# Patient Record
Sex: Male | Born: 1955
Health system: Southern US, Community
[De-identification: ages and names within clinical notes are randomized; demographics above are authoritative.]

## PROBLEM LIST (undated history)

## (undated) DIAGNOSIS — I4891 Unspecified atrial fibrillation: Secondary | ICD-10-CM

## (undated) DIAGNOSIS — R569 Unspecified convulsions: Secondary | ICD-10-CM

## (undated) DIAGNOSIS — G20 Parkinson's disease: Secondary | ICD-10-CM

## (undated) DIAGNOSIS — E7800 Pure hypercholesterolemia, unspecified: Secondary | ICD-10-CM

## (undated) DIAGNOSIS — G20A1 Parkinson's disease without dyskinesia, without mention of fluctuations: Secondary | ICD-10-CM

## (undated) HISTORY — PX: CARDIAC ELECTROPHYSIOLOGY STUDY AND ABLATION: SHX1294

## (undated) HISTORY — PX: APPENDECTOMY: SHX54

---

## 2007-07-24 ENCOUNTER — Emergency Department (HOSPITAL_COMMUNITY): Admission: EM | Admit: 2007-07-24 | Discharge: 2007-07-24 | Payer: Self-pay | Admitting: Emergency Medicine

## 2015-03-25 ENCOUNTER — Encounter (HOSPITAL_COMMUNITY): Payer: Self-pay | Admitting: Emergency Medicine

## 2015-03-25 ENCOUNTER — Emergency Department (HOSPITAL_COMMUNITY): Payer: Medicare Other

## 2015-03-25 ENCOUNTER — Emergency Department (HOSPITAL_COMMUNITY)
Admission: EM | Admit: 2015-03-25 | Discharge: 2015-03-25 | Disposition: A | Discharge status: Home / Self Care | Payer: Medicare Other | Attending: Emergency Medicine | Admitting: Emergency Medicine

## 2015-03-25 DIAGNOSIS — W1839XA Other fall on same level, initial encounter: Secondary | ICD-10-CM | POA: Insufficient documentation

## 2015-03-25 DIAGNOSIS — Z7982 Long term (current) use of aspirin: Secondary | ICD-10-CM | POA: Insufficient documentation

## 2015-03-25 DIAGNOSIS — E780 Pure hypercholesterolemia: Secondary | ICD-10-CM | POA: Diagnosis not present

## 2015-03-25 DIAGNOSIS — Z79899 Other long term (current) drug therapy: Secondary | ICD-10-CM | POA: Insufficient documentation

## 2015-03-25 DIAGNOSIS — R079 Chest pain, unspecified: Secondary | ICD-10-CM

## 2015-03-25 DIAGNOSIS — S20211A Contusion of right front wall of thorax, initial encounter: Secondary | ICD-10-CM | POA: Diagnosis not present

## 2015-03-25 DIAGNOSIS — Y9289 Other specified places as the place of occurrence of the external cause: Secondary | ICD-10-CM | POA: Insufficient documentation

## 2015-03-25 DIAGNOSIS — Y9389 Activity, other specified: Secondary | ICD-10-CM | POA: Insufficient documentation

## 2015-03-25 DIAGNOSIS — W19XXXA Unspecified fall, initial encounter: Secondary | ICD-10-CM

## 2015-03-25 DIAGNOSIS — R0602 Shortness of breath: Secondary | ICD-10-CM | POA: Insufficient documentation

## 2015-03-25 DIAGNOSIS — S29001A Unspecified injury of muscle and tendon of front wall of thorax, initial encounter: Secondary | ICD-10-CM | POA: Diagnosis present

## 2015-03-25 DIAGNOSIS — Y998 Other external cause status: Secondary | ICD-10-CM | POA: Insufficient documentation

## 2015-03-25 HISTORY — DX: Pure hypercholesterolemia, unspecified: E78.00

## 2015-03-25 HISTORY — DX: Unspecified convulsions: R56.9

## 2015-03-25 MED ORDER — OXYCODONE-ACETAMINOPHEN 5-325 MG PO TABS
2.0000 | ORAL_TABLET | Freq: Once | ORAL | Status: AC
  Administered 2015-03-25: 2 via ORAL
  Filled 2015-03-25: qty 2

## 2015-03-25 MED ORDER — OXYCODONE-ACETAMINOPHEN 5-325 MG PO TABS: 2.0000 | ORAL_TABLET | ORAL | Status: DC | PRN

## 2015-03-25 NOTE — ED Notes (Addendum)
Patient reports fell last week over a trailer hitch. Reports right chest has been hurting since then. Complaining of chest pain and shortness of breath. States chest pain is worse with movement, coughing, or palpation.

## 2015-03-25 NOTE — Discharge Instructions (Signed)
Place a pillow against yourchest in either cough, or take 10 deep breaths several times per day. Percocet for pain  Chest Wall Pain Chest wall pain is pain in or around the bones and muscles of your chest. It may take up to 6 weeks to get better. It may take longer if you must stay physically active in your work and activities.  CAUSES  Chest wall pain may happen on its own. However, it may be caused by:  A viral illness like the flu.  Injury.  Coughing.  Exercise.  Arthritis.  Fibromyalgia.  Shingles. HOME CARE INSTRUCTIONS   Avoid overtiring physical activity. Try not to strain or perform activities that cause pain. This includes any activities using your chest or your abdominal and side muscles, especially if heavy weights are used.  Put ice on the sore area.  Put ice in a plastic bag.  Place a towel between your skin and the bag.  Leave the ice on for 15-20 minutes per hour while awake for the first 2 days.  Only take over-the-counter or prescription medicines for pain, discomfort, or fever as directed by your caregiver. SEEK IMMEDIATE MEDICAL CARE IF:   Your pain increases, or you are very uncomfortable.  You have a fever.  Your chest pain becomes worse.  You have new, unexplained symptoms.  You have nausea or vomiting.  You feel sweaty or lightheaded.  You have a cough with phlegm (sputum), or you cough up blood. MAKE SURE YOU:   Understand these instructions.  Will watch your condition.  Will get help right away if you are not doing well or get worse. Document Released: 11/25/2005 Document Revised: 02/17/2012 Document Reviewed: 07/22/2011 [facility] Patient Information 2015 [place], [place]. This information is not intended to replace advice given to you by your health care provider. Make sure you discuss any questions you have with your health care provider.

## 2015-03-25 NOTE — ED Provider Notes (Signed)
CSN: [id number]     Arrival date & time 03/25/15  2015 History  This chart was scribed for Rolland PorterMark Lachlan Mckim, MD by Jarvis Morganaylor Ferguson, ED Scribe. This patient was seen in room APA14/APA14 and the patient's care was started at 8:34 PM.    Chief Complaint  Patient presents with  . Chest Pain  . Shortness of Breath    The history is provided by the patient. No language interpreter was used.    HPI Comments: Eric [name] is a 59 y.o. male with a h/o seizures and hypercholesteremia who presents to the Emergency Department complaining of constant, moderate, right parasternal chest pain that began 1 week ago. Pt states he was helping someone hitch a trailer and fell over the hitch and has been having chest pain ever since. Pt believes he hit his chest on the hitch when he fell. He is having associated intermittent SOB. Pt takes 81 mg aspirin daily. He reports the pain is worse with movement and coughing. He denies any abnormal breath sounds. Pt states he took  Motrin and prescription pain medication at home with no relief.  Pt reports the prescription pain medications were for a previous problem. He denies any head injury. He denies any neck pain, back pain, abdominal pain, or upper and lower extremity pain.    Past Medical History  Diagnosis Date  . Hypercholesteremia   . Seizures    History reviewed. No pertinent past surgical history. History reviewed. No pertinent family history. History  Substance Use Topics  . Smoking status: Never Smoker   . Smokeless tobacco: Not on file  . Alcohol Use: No    Review of Systems  Constitutional: Negative for fever, chills, diaphoresis, appetite change and fatigue.  HENT: Negative for mouth sores, sore throat and trouble swallowing.   Eyes: Negative for visual disturbance.  Respiratory: Positive for shortness of breath. Negative for cough, chest tightness and wheezing.   Cardiovascular: Positive for chest pain.  Gastrointestinal: Negative for nausea,  vomiting, abdominal pain, diarrhea and abdominal distention.  Endocrine: Negative for polydipsia, polyphagia and polyuria.  Genitourinary: Negative for dysuria, frequency and hematuria.  Musculoskeletal: Negative for back pain, arthralgias, gait problem and neck pain.  Skin: Negative for color change, pallor and rash.  Neurological: Negative for dizziness, syncope, light-headedness and headaches.  Hematological: Does not bruise/bleed easily.  Psychiatric/Behavioral: Negative for behavioral problems and confusion.      Allergies  Review of patient's allergies indicates no known allergies.  Home Medications   Prior to Admission medications   Medication Sig Start Date End Date Taking? Authorizing Provider  aspirin EC 81 MG tablet Take 81 mg by mouth daily.   Yes Historical Provider, MD  carbamazepine (TEGRETOL) 200 MG tablet Take 400 mg by mouth 3 (three) times daily.   Yes Historical Provider, MD  divalproex (DEPAKOTE ER) 250 MG 24 hr tablet Take 250 mg by mouth at bedtime. *TAKES IN ADDITION TO 2 500MG  CAPSULES FOR A TOTAL 1250MG  AT BEDTIME   Yes Historical Provider, MD  divalproex (DEPAKOTE ER) 500 MG 24 hr tablet Take 1,000 mg by mouth 2 (two) times daily.   Yes Historical Provider, MD  sildenafil (VIAGRA) 100 MG tablet Take 100 mg by mouth daily as needed for erectile dysfunction.   Yes Historical Provider, MD  simvastatin (ZOCOR) 40 MG tablet Take 40 mg by mouth at bedtime.   Yes Historical Provider, MD  Vitamin D, Ergocalciferol, (DRISDOL) 50000 UNITS CAPS capsule Take 50,000 Units by mouth every Thursday.  Yes Historical Provider, MD  oxyCODONE-acetaminophen (PERCOCET/ROXICET) 5-325 MG per tablet Take 2 tablets by mouth every 4 (four) hours as needed. 03/25/15   Rolland Porter, MD   Triage Vitals: BP 154/82 mmHg  Pulse 59  Temp(Src) 98 F (36.7 C) (Oral)  Resp 20  Ht  (1.778 m)  Wt 260 lb (117.935 kg)  BMI 37.31 kg/m2  SpO2 99%  Physical Exam  Constitutional: He is  oriented to person, place, and time. He appears well-developed and well-nourished. No distress.  HENT:  Head: Normocephalic.  Mouth/Throat: Uvula is midline, oropharynx is clear and moist and mucous membranes are normal.  Eyes: Conjunctivae are normal. Pupils are equal, round, and reactive to light. No scleral icterus.  Neck: Normal range of motion. Neck supple. No thyromegaly present.  Cardiovascular: Normal rate and regular rhythm.  Exam reveals no gallop and no friction rub.   No murmur heard. Pulmonary/Chest: Effort normal and breath sounds normal. No respiratory distress. He has no wheezes. He has no rales.  No crepitus. Tenderness to right anterior chest. No subcutaneous air of the chest.   Abdominal: Soft. Bowel sounds are normal. He exhibits no distension. There is no tenderness. There is no rebound.  Musculoskeletal: Normal range of motion.  Neurological: He is alert and oriented to person, place, and time.  Skin: Skin is warm and dry. No rash noted.  Psychiatric: He has a normal mood and affect. His behavior is normal.    ED Course  Procedures (including critical care time)  DIAGNOSTIC STUDIES: Oxygen Saturation is 99% on RA, normal by my interpretation.    COORDINATION OF CARE:    Labs Review Labs Reviewed - No data to display  Imaging Review Dg Ribs Unilateral W/chest Right  03/25/2015   CLINICAL DATA:  Constant, moderate right parasternal chest pain after helping hitch a trailer.  EXAM: RIGHT RIBS AND CHEST - 3+ VIEW  COMPARISON:  None.  FINDINGS: Normal heart size and mediastinal contours. No acute infiltrate or edema. No effusion or pneumothorax. No fracture or focal bone lesion involving the right ribs. The pain marker overlaps the right fifth anterior costochondral junction.  IMPRESSION: Negative.   Electronically Signed   By: Marnee Spring M.D.   On: 03/25/2015 21:40     EKG Interpretation None      MDM   Final diagnoses:  Fall  Chest pain  Chest wall  contusion, right, initial encounter    X-ray shows no acute moderately severe chest wall or mono. Plan is home, pulmonary toilet discussed. Percent prescription.  I personally performed the services described in this documentation, which was scribed in my presence. The recorded information has been reviewed and is accurate.     Rolland Porter, MD 03/25/15 [phone]

## 2015-03-25 NOTE — ED Notes (Signed)
Pt alert & oriented x4, stable gait. Patient given discharge instructions, paperwork & prescription(s). Patient informed not to drive, operate any equipment & handel any important documents 4 hours after taking pain medication. Patient  instructed to stop at the registration desk to finish any additional paperwork. Patient  verbalized understanding. Pt left department w/ no further questions. 

## 2015-12-24 ENCOUNTER — Emergency Department (HOSPITAL_COMMUNITY)
Admission: EM | Admit: 2015-12-24 | Discharge: 2015-12-24 | Disposition: A | Discharge status: Home / Self Care | Payer: Medicare Other | Attending: Emergency Medicine | Admitting: Emergency Medicine

## 2015-12-24 ENCOUNTER — Encounter (HOSPITAL_COMMUNITY): Payer: Self-pay | Admitting: Emergency Medicine

## 2015-12-24 DIAGNOSIS — W57XXXA Bitten or stung by nonvenomous insect and other nonvenomous arthropods, initial encounter: Secondary | ICD-10-CM | POA: Diagnosis not present

## 2015-12-24 DIAGNOSIS — Z79899 Other long term (current) drug therapy: Secondary | ICD-10-CM | POA: Diagnosis not present

## 2015-12-24 DIAGNOSIS — Y9389 Activity, other specified: Secondary | ICD-10-CM | POA: Diagnosis not present

## 2015-12-24 DIAGNOSIS — E7800 Pure hypercholesterolemia, unspecified: Secondary | ICD-10-CM | POA: Diagnosis not present

## 2015-12-24 DIAGNOSIS — S1086XA Insect bite of other specified part of neck, initial encounter: Secondary | ICD-10-CM | POA: Diagnosis not present

## 2015-12-24 DIAGNOSIS — Y998 Other external cause status: Secondary | ICD-10-CM | POA: Diagnosis not present

## 2015-12-24 DIAGNOSIS — Y9289 Other specified places as the place of occurrence of the external cause: Secondary | ICD-10-CM | POA: Insufficient documentation

## 2015-12-24 DIAGNOSIS — Z7982 Long term (current) use of aspirin: Secondary | ICD-10-CM | POA: Diagnosis not present

## 2015-12-24 DIAGNOSIS — S1096XA Insect bite of unspecified part of neck, initial encounter: Secondary | ICD-10-CM | POA: Diagnosis not present

## 2015-12-24 MED ORDER — BACITRACIN ZINC 500 UNIT/GM EX OINT
TOPICAL_OINTMENT | Freq: Once | CUTANEOUS | Status: AC
  Administered 2015-12-24: 15:00:00 via TOPICAL

## 2015-12-24 MED ORDER — BACITRACIN ZINC 500 UNIT/GM EX OINT
TOPICAL_OINTMENT | CUTANEOUS | Status: AC
  Filled 2015-12-24: qty 0.9

## 2015-12-24 MED ORDER — BACITRACIN-NEOMYCIN-POLYMYXIN 400-5-5000 EX OINT: TOPICAL_OINTMENT | Freq: Once | CUTANEOUS | Status: DC

## 2015-12-24 NOTE — ED Provider Notes (Signed)
CSN: [id number]     Arrival date & time 12/24/15  1301 History  By signing my name below, I, Elon Spanner, attest that this documentation has been prepared under the direction and in the presence of Trisha Mangle, PA-C. Electronically Signed: Elon Spanner ED Scribe. 12/24/2015. 1:32 PM.    Chief Complaint  Patient presents with  . Tick Removal   The history is provided by the patient. No language interpreter was used.   HPI Comments: Eric [name] is a 60 y.o. male with hx of seizures who presents to the Emergency Department requesting tick removal from the right-sided neck.  The patient reports his wife was able to remove part of the tick but he believes some pieces are still lodged in place.  The patient uses daily ASA but no other anti-coagulants.  He denies other complaints.   Past Medical History  Diagnosis Date  . Hypercholesteremia   . Seizures (HCC)    History reviewed. No pertinent past surgical history. No family history on file. Social History  Substance Use Topics  . Smoking status: Never Smoker   . Smokeless tobacco: Never Used  . Alcohol Use: No    Review of Systems 10 Systems reviewed and all are negative for acute change except as noted in the HPI.  Allergies  Review of patient's allergies indicates no known allergies.  Home Medications   Prior to Admission medications   Medication Sig Start Date End Date Taking? Authorizing Provider  aspirin EC 81 MG tablet Take 81 mg by mouth daily.    Historical Provider, MD  carbamazepine (TEGRETOL) 200 MG tablet Take 400 mg by mouth 3 (three) times daily.    Historical Provider, MD  divalproex (DEPAKOTE ER) 250 MG 24 hr tablet Take 250 mg by mouth at bedtime. *TAKES IN ADDITION TO 2 500MG  CAPSULES FOR A TOTAL 1250MG  AT BEDTIME    Historical Provider, MD  divalproex (DEPAKOTE ER) 500 MG 24 hr tablet Take 1,000 mg by mouth 2 (two) times daily.    Historical Provider, MD  oxyCODONE-acetaminophen (PERCOCET/ROXICET) 5-325 MG  per tablet Take 2 tablets by mouth every 4 (four) hours as needed. 03/25/15   Rolland Porter, MD  sildenafil (VIAGRA) 100 MG tablet Take 100 mg by mouth daily as needed for erectile dysfunction.    Historical Provider, MD  simvastatin (ZOCOR) 40 MG tablet Take 40 mg by mouth at bedtime.    Historical Provider, MD  Vitamin D, Ergocalciferol, (DRISDOL) 50000 UNITS CAPS capsule Take 50,000 Units by mouth every Thursday.    Historical Provider, MD   BP 133/77 mmHg  Pulse 58  Temp(Src) 98.2 F (36.8 C) (Oral)  Resp 18  Ht 5\' 11"  (1.803 m)  Wt 260 lb (117.935 kg)  BMI 36.28 kg/m2  SpO2 96% Physical Exam  Constitutional: He is oriented to person, place, and time. He appears well-developed and well-nourished. No distress.  HENT:  Head: Normocephalic and atraumatic.  Eyes: Conjunctivae and EOM are normal.  Neck: Neck supple. No tracheal deviation present.  Cardiovascular: Normal rate.   Pulmonary/Chest: Effort normal. No respiratory distress.  Musculoskeletal: Normal range of motion.  Neurological: He is alert and oriented to person, place, and time.  Skin: Skin is warm and dry.  Psychiatric: He has a normal mood and affect. His behavior is normal.  Nursing note and vitals reviewed.   ED Course  Procedures (including critical care time)  DIAGNOSTIC STUDIES: Oxygen Saturation is 96% on RA, normal by my interpretation.  COORDINATION OF CARE:  2:37 PM Discuss plan to remove tick.  Patient acknowledges and agrees with plan.    Labs Review Labs Reviewed - No data to display  Imaging Review No results found. I have personally reviewed and evaluated these images and lab results as part of my medical decision-making.   EKG Interpretation None      MDM  Tick  Head removed with 18 gauge and twizzer   Final diagnoses:  Tick bite     I personally performed the services in this documentation, which was scribed in my presence.  The recorded information has been reviewed and  considered.   Barnet PallKaren SofiaPAC.   Elson AreasLeslie K Sofia, PA-C 12/24/15 [address]1501  Leslie K HarrisonSofia, PA-C 12/24/15 1523  Donnetta HutchingBrian Cook, MD 12/27/15 1332

## 2015-12-24 NOTE — Discharge Instructions (Signed)
Tick Bite Information Ticks are insects that attach themselves to the skin and draw blood for food. There are various types of ticks. Common types include wood ticks and deer ticks. Most ticks live in shrubs and grassy areas. Ticks can climb onto your body when you make contact with leaves or grass where the tick is waiting. The most common places on the body for ticks to attach themselves are the scalp, neck, armpits, waist, and groin. Most tick bites are harmless, but sometimes ticks carry germs that cause diseases. These germs can be spread to a person during the tick's feeding process. The chance of a disease spreading through a tick bite depends on:   The type of tick.  Time of year.   How long the tick is attached.   Geographic location.  HOW CAN YOU PREVENT TICK BITES? Take these steps to help prevent tick bites when you are outdoors:  Wear protective clothing. Long sleeves and long pants are best.   Wear white clothes so you can see ticks more easily.  Tuck your pant legs into your socks.   If walking on a trail, stay in the middle of the trail to avoid brushing against bushes.  Avoid walking through areas with long grass.  Put insect repellent on all exposed skin and along boot tops, pant legs, and sleeve cuffs.   Check clothing, hair, and skin repeatedly and before going inside.   Brush off any ticks that are not attached.  Take a shower or bath as soon as possible after being outdoors.  WHAT IS THE PROPER WAY TO REMOVE A TICK? Ticks should be removed as soon as possible to help prevent diseases caused by tick bites. 1. If latex gloves are available, put them on before trying to remove a tick.  2. Using fine-point tweezers, grasp the tick as close to the skin as possible. You may also use curved forceps or a tick removal tool. Grasp the tick as close to its head as possible. Avoid grasping the tick on its body. 3. Pull gently with steady upward pressure until  the tick lets go. Do not twist the tick or jerk it suddenly. This may break off the tick's head or mouth parts. 4. Do not squeeze or crush the tick's body. This could force disease-carrying fluids from the tick into your body.  5. After the tick is removed, wash the bite area and your hands with soap and water or other disinfectant such as alcohol. 6. Apply a small amount of antiseptic cream or ointment to the bite site.  7. Wash and disinfect any instruments that were used.  Do not try to remove a tick by applying a hot match, petroleum jelly, or fingernail polish to the tick. These methods do not work and may increase the chances of disease being spread from the tick bite.  WHEN SHOULD YOU SEEK MEDICAL CARE? Contact your health care provider if you are unable to remove a tick from your skin or if a part of the tick breaks off and is stuck in the skin.  After a tick bite, you need to be aware of signs and symptoms that could be related to diseases spread by ticks. Contact your health care provider if you develop any of the following in the days or weeks after the tick bite:  Unexplained fever.  Rash. A circular rash that appears days or weeks after the tick bite may indicate the possibility of Lyme disease. The rash may resemble   a target with a bull's-eye and may occur at a different part of your body than the tick bite.  Redness and swelling in the area of the tick bite.   Tender, swollen lymph glands.   Diarrhea.   Weight loss.   Cough.   Fatigue.   Muscle, joint, or bone pain.   Abdominal pain.   Headache.   Lethargy or a change in your level of consciousness.  Difficulty walking or moving your legs.   Numbness in the legs.   Paralysis.  Shortness of breath.   Confusion.   Repeated vomiting.    This information is not intended to replace advice given to you by your health care provider. Make sure you discuss any questions you have with your health  care provider.   Document Released: 11/22/2000 Document Revised: 12/16/2014 Document Reviewed: 05/05/2013 Elsevier Interactive Patient Education 2016 [employer]  

## 2015-12-24 NOTE — ED Notes (Signed)
Patient here for tick removal. Per patient removed tick from right side of neck but "head of tick still in skin."

## 2016-02-25 ENCOUNTER — Encounter (HOSPITAL_COMMUNITY): Payer: Self-pay | Admitting: *Deleted

## 2016-02-25 ENCOUNTER — Emergency Department (HOSPITAL_COMMUNITY)
Admission: EM | Admit: 2016-02-25 | Discharge: 2016-02-25 | Disposition: A | Discharge status: Home / Self Care | Payer: Medicare Other | Attending: Emergency Medicine | Admitting: Emergency Medicine

## 2016-02-25 ENCOUNTER — Emergency Department (HOSPITAL_COMMUNITY): Payer: Medicare Other

## 2016-02-25 DIAGNOSIS — J189 Pneumonia, unspecified organism: Secondary | ICD-10-CM | POA: Insufficient documentation

## 2016-02-25 DIAGNOSIS — Z7982 Long term (current) use of aspirin: Secondary | ICD-10-CM | POA: Insufficient documentation

## 2016-02-25 DIAGNOSIS — E7800 Pure hypercholesterolemia, unspecified: Secondary | ICD-10-CM | POA: Diagnosis not present

## 2016-02-25 DIAGNOSIS — Z79899 Other long term (current) drug therapy: Secondary | ICD-10-CM | POA: Insufficient documentation

## 2016-02-25 DIAGNOSIS — R509 Fever, unspecified: Secondary | ICD-10-CM | POA: Diagnosis not present

## 2016-02-25 DIAGNOSIS — R05 Cough: Secondary | ICD-10-CM | POA: Diagnosis not present

## 2016-02-25 MED ORDER — AZITHROMYCIN 250 MG PO TABS
500.0000 mg | ORAL_TABLET | Freq: Once | ORAL | Status: AC
  Administered 2016-02-25: 500 mg via ORAL
  Filled 2016-02-25: qty 2

## 2016-02-25 MED ORDER — AZITHROMYCIN 250 MG PO TABS: ORAL_TABLET | ORAL | Status: DC

## 2016-02-25 MED ORDER — DEXAMETHASONE 4 MG PO TABS: 4.0000 mg | ORAL_TABLET | Freq: Two times a day (BID) | ORAL | Status: DC

## 2016-02-25 MED ORDER — CEFTRIAXONE SODIUM 1 G IJ SOLR
1.0000 g | Freq: Once | INTRAMUSCULAR | Status: AC
  Administered 2016-02-25: 1 g via INTRAMUSCULAR
  Filled 2016-02-25: qty 10

## 2016-02-25 MED ORDER — HYDROCODONE-HOMATROPINE 5-1.5 MG/5ML PO SYRP
5.0000 mL | ORAL_SOLUTION | Freq: Four times a day (QID) | ORAL | Status: DC | PRN
Start: 2016-02-25 — End: 2016-11-10

## 2016-02-25 MED ORDER — LIDOCAINE HCL (PF) 1 % IJ SOLN
INTRAMUSCULAR | Status: AC
  Administered 2016-02-25: 5 mL
  Filled 2016-02-25: qty 5

## 2016-02-25 MED ORDER — PREDNISONE 10 MG PO TABS
60.0000 mg | ORAL_TABLET | Freq: Once | ORAL | Status: AC
  Administered 2016-02-25: 60 mg via ORAL
  Filled 2016-02-25: qty 1

## 2016-02-25 MED ORDER — HYDROCOD POLST-CPM POLST ER 10-8 MG/5ML PO SUER
5.0000 mL | Freq: Once | ORAL | Status: AC
  Administered 2016-02-25: 5 mL via ORAL
  Filled 2016-02-25: qty 5

## 2016-02-25 NOTE — ED Provider Notes (Signed)
History  By signing my name below, I, Karle Plumber, attest that this documentation has been prepared under the direction and in the presence of Ivery Quale, PA-C. Electronically Signed: Karle Plumber, ED Scribe. 02/25/2016. 7:16 PM.  Chief Complaint  Patient presents with  . Cough   The history is provided by the patient and medical records. No language interpreter was used.    HPI Comments:  Eric [name] is a 60 y.o. obese male who presents to the Emergency Department complaining of subjective fever and chills that began about three days ago. He reports associated "shakes", nausea and productive cough of clear mucous. He has not taken anything to treat his symptoms. He denies modifying factors. He denies vomiting, diarrhea, rash, nasal congestion. He denies smoking but states people in his household smoke but not inside.He reports h/o HLD and seizures.  Past Medical History  Diagnosis Date  . Hypercholesteremia   . Seizures (HCC)    History reviewed. No pertinent past surgical history. No family history on file. Social History  Substance Use Topics  . Smoking status: Never Smoker   . Smokeless tobacco: Never Used  . Alcohol Use: No    Review of Systems  Constitutional: Positive for fever and chills.  Respiratory: Positive for cough.   Gastrointestinal: Positive for nausea.  All other systems reviewed and are negative.   Allergies  Review of patient's allergies indicates no known allergies.  Home Medications   Prior to Admission medications   Medication Sig Start Date End Date Taking? Authorizing Provider  aspirin EC 81 MG tablet Take 81 mg by mouth daily.   Yes Historical Provider, MD  carbamazepine (TEGRETOL) 200 MG tablet Take 400 mg by mouth 3 (three) times daily.   Yes Historical Provider, MD  divalproex (DEPAKOTE ER) 250 MG 24 hr tablet Take 250 mg by mouth at bedtime. *TAKES IN ADDITION TO 2  CAPSULES FOR A TOTAL  AT BEDTIME   Yes Historical  Provider, MD  divalproex (DEPAKOTE ER) 500 MG 24 hr tablet Take 1,000 mg by mouth 2 (two) times daily.   Yes Historical Provider, MD  simvastatin (ZOCOR) 40 MG tablet Take 40 mg by mouth at bedtime.   Yes Historical Provider, MD  oxyCODONE-acetaminophen (PERCOCET/ROXICET) 5-325 MG per tablet Take 2 tablets by mouth every 4 (four) hours as needed. Patient not taking: Reported on 02/25/2016 03/25/15   Rolland Porter, MD  sildenafil (VIAGRA) 100 MG tablet Take 100 mg by mouth daily as needed for erectile dysfunction.    Historical Provider, MD  Vitamin D, Ergocalciferol, (DRISDOL) 50000 UNITS CAPS capsule Take 50,000 Units by mouth every Thursday.    Historical Provider, MD   Triage Vitals: BP 151/86 mmHg  Pulse 79  Temp(Src) 99.6 F (37.6 C) (Oral)  Resp 18  Ht  (1.803 m)  Wt 260 lb (117.935 kg)  BMI 36.28 kg/m2  SpO2 100% Physical Exam  Constitutional: He is oriented to person, place, and time. He appears well-developed and well-nourished.  HENT:  Head: Normocephalic and atraumatic.  Mild nasal congestion present.  Eyes: EOM are normal.  Neck: Normal range of motion.  Cardiovascular: Normal rate, regular rhythm and normal heart sounds.  Exam reveals no gallop and no friction rub.   No murmur heard. Pulmonary/Chest: Effort normal. No respiratory distress. He has no wheezes. He has no rales.  Pt speaks in complete sentences. Symmetrical rise and fall of the chest. Course breath sounds but no consolidation.  Musculoskeletal: Normal range of motion.  Cap refill less than two seconds.  Neurological: He is alert and oriented to person, place, and time.  Tremors present  Skin: Skin is warm and dry.  Good color. No rash.  Psychiatric: He has a normal mood and affect. His behavior is normal.  Nursing note and vitals reviewed.   ED Course  Procedures (including critical care time) DIAGNOSTIC STUDIES: Oxygen Saturation is 100% on RA, normal by my interpretation.   COORDINATION OF  CARE: 7:16 PM- Will order CXR. Pt verbalizes understanding and agrees to plan.  Medications - No data to display  Labs Review Labs Reviewed - No data to display  Imaging Review No results found. I have personally reviewed and evaluated these images and lab results as part of my medical decision-making.   EKG Interpretation None      MDM  Vital signs reviewed. Pulse oximetry is 100% on room air, within normal limits by my interpretation. Chest x-ray is consistent with a right upper lobe pneumonia. The patient was treated in the emergency department with intramuscular Rocephin and Zithromax. Prescription for Zithromax, Decadron, and Hycodan given to the patient. The patient will use Tylenol every 4 hours for fever or aching. The patient strongly encouraged to see his primary physician Dr. Ernestine McmurrayIngle in 3-4 days for recheck. Patient is in agreement with this discharge instruction.    Final diagnoses:  Community acquired pneumonia    *I have reviewed nursing notes, vital signs, and all appropriate lab and imaging results for this patient.**  I personally performed the services described in this documentation, which was scribed in my presence. The recorded information has been reviewed and is accurate.    Ivery QualeHobson Loring Liskey, PA-C 02/27/16 1032  Raeford RazorStephen Kohut, MD 02/29/16 1600

## 2016-02-25 NOTE — Discharge Instructions (Signed)
Your x-ray suggests right lung pneumonia. Please increase fluids. Use Tylenol every 4 hours for fever and aching. Please usual mask until symptoms have resolved. Zithromax 1 tablet daily until all taken. Decadron 2 times daily until all taken. Use Hycodan for cough. This medication may cause drowsiness, please use with caution. Please see your primary physician later this week for follow-up and recheck. Return to the emergency department if any changes, difficulty breathing, or concerns. Community-Acquired Pneumonia, Adult Pneumonia is an infection of the lungs. One type of pneumonia can happen while a person is in a hospital. A different type can happen when a person is not in a hospital (community-acquired pneumonia). It is easy for this kind to spread from person to person. It can spread to you if you breathe near an infected person who coughs or sneezes. Some symptoms include:  A dry cough.  A wet (productive) cough.  Fever.  Sweating.  Chest pain. HOME CARE  Take over-the-counter and prescription medicines only as told by your doctor.  Only take cough medicine if you are losing sleep.  If you were prescribed an antibiotic medicine, take it as told by your doctor. Do not stop taking the antibiotic even if you start to feel better.  Sleep with your head and neck raised (elevated). You can do this by putting a few pillows under your head, or you can sleep in a recliner.  Do not use tobacco products. These include cigarettes, chewing tobacco, and e-cigarettes. If you need help quitting, ask your doctor.  Drink enough water to keep your pee (urine) clear or pale yellow. A shot (vaccine) can help prevent pneumonia. Shots are often suggested for:  People older than 60 years of age.  People older than 60 years of age:  Who are having cancer treatment.  Who have long-term (chronic) lung disease.  Who have problems with their body's defense system (immune system). You may also prevent  pneumonia if you take these actions:  Get the flu (influenza) shot every year.  Go to the dentist as often as told.  Wash your hands often. If soap and water are not available, use hand sanitizer. GET HELP IF:  You have a fever.  You lose sleep because your cough medicine does not help. GET HELP RIGHT AWAY IF:  You are short of breath and it gets worse.  You have more chest pain.  Your sickness gets worse. This is very serious if:  You are an older adult.  Your body's defense system is weak.  You cough up blood.   This information is not intended to replace advice given to you by your health care provider. Make sure you discuss any questions you have with your health care provider.   Document Released: 05/13/2008 Document Revised: 08/16/2015 Document Reviewed: 03/22/2015 Elsevier Interactive Patient Education [employer]2016 Elsevier Inc.

## 2016-02-25 NOTE — ED Notes (Signed)
Pt comes in with cough, congestion, and fevers starting 3 days ago. Pt denies any n/v/d. Denies any pain.

## 2016-11-10 ENCOUNTER — Encounter (HOSPITAL_COMMUNITY): Payer: Self-pay | Admitting: Emergency Medicine

## 2016-11-10 ENCOUNTER — Emergency Department (HOSPITAL_COMMUNITY): Payer: Medicare Other

## 2016-11-10 ENCOUNTER — Emergency Department (HOSPITAL_COMMUNITY)
Admission: EM | Admit: 2016-11-10 | Discharge: 2016-11-10 | Disposition: A | Discharge status: Home / Self Care | Payer: Medicare Other | Attending: Emergency Medicine | Admitting: Emergency Medicine

## 2016-11-10 DIAGNOSIS — Z7901 Long term (current) use of anticoagulants: Secondary | ICD-10-CM | POA: Insufficient documentation

## 2016-11-10 DIAGNOSIS — Z79899 Other long term (current) drug therapy: Secondary | ICD-10-CM | POA: Insufficient documentation

## 2016-11-10 DIAGNOSIS — S199XXA Unspecified injury of neck, initial encounter: Secondary | ICD-10-CM | POA: Diagnosis not present

## 2016-11-10 DIAGNOSIS — R55 Syncope and collapse: Secondary | ICD-10-CM | POA: Diagnosis not present

## 2016-11-10 DIAGNOSIS — R569 Unspecified convulsions: Secondary | ICD-10-CM | POA: Diagnosis not present

## 2016-11-10 LAB — COMPREHENSIVE METABOLIC PANEL
ALK PHOS: 37 U/L — AB (ref 38–126)
ALT: 8 U/L — ABNORMAL LOW (ref 17–63)
AST: 16 U/L (ref 15–41)
Albumin: 2.9 g/dL — ABNORMAL LOW (ref 3.5–5.0)
Anion gap: 6 (ref 5–15)
BUN: 9 mg/dL (ref 6–20)
CHLORIDE: 103 mmol/L (ref 101–111)
CO2: 29 mmol/L (ref 22–32)
Calcium: 8.4 mg/dL — ABNORMAL LOW (ref 8.9–10.3)
Creatinine, Ser: 0.92 mg/dL (ref 0.61–1.24)
GFR calc Af Amer: >60 mL/min (ref >60)
GFR calc non Af Amer: >60 mL/min (ref >60)
Glucose, Bld: 82 mg/dL (ref 65–99)
Potassium: 3.8 mmol/L (ref 3.5–5.1)
SODIUM: 138 mmol/L (ref 135–145)
Total Bilirubin: 0.3 mg/dL (ref 0.3–1.2)
Total Protein: 6.4 g/dL — ABNORMAL LOW (ref 6.5–8.1)

## 2016-11-10 LAB — I-STAT CHEM 8, ED
BUN: 6 mg/dL (ref 6–20)
CALCIUM ION: 1.17 mmol/L (ref 1.15–1.40)
CHLORIDE: 103 mmol/L (ref 101–111)
Creatinine, Ser: 1 mg/dL (ref 0.61–1.24)
GLUCOSE: 76 mg/dL (ref 65–99)
HCT: 33 % — ABNORMAL LOW (ref 39.0–52.0)
Hemoglobin: 11.2 g/dL — ABNORMAL LOW (ref 13.0–17.0)
POTASSIUM: 3.8 mmol/L (ref 3.5–5.1)
Sodium: 141 mmol/L (ref 135–145)
TCO2: 27 mmol/L (ref 0–100)

## 2016-11-10 LAB — CBC WITH DIFFERENTIAL/PLATELET
BASOS PCT: 0 %
Basophils Absolute: 0 10*3/uL (ref 0.0–0.1)
EOS ABS: 0.2 10*3/uL (ref 0.0–0.7)
EOS PCT: 3 %
HCT: 33.8 % — ABNORMAL LOW (ref 39.0–52.0)
Hemoglobin: 11.3 g/dL — ABNORMAL LOW (ref 13.0–17.0)
LYMPHS ABS: 2.2 10*3/uL (ref 0.7–4.0)
Lymphocytes Relative: 30 %
MCH: 34.2 pg — ABNORMAL HIGH (ref 26.0–34.0)
MCHC: 33.4 g/dL (ref 30.0–36.0)
MCV: 102.4 fL — ABNORMAL HIGH (ref 78.0–100.0)
Monocytes Absolute: 1.7 10*3/uL — ABNORMAL HIGH (ref 0.1–1.0)
Monocytes Relative: 24 %
NEUTROS PCT: 43 %
Neutro Abs: 3.2 10*3/uL (ref 1.7–7.7)
PLATELETS: 194 10*3/uL (ref 150–400)
RBC: 3.3 MIL/uL — AB (ref 4.22–5.81)
RDW: 13.2 % (ref 11.5–15.5)
WBC: 7.3 10*3/uL (ref 4.0–10.5)

## 2016-11-10 LAB — I-STAT TROPONIN, ED: Troponin i, poc: 0 ng/mL (ref 0.00–0.08)

## 2016-11-10 LAB — VALPROIC ACID LEVEL: Valproic Acid Lvl: 81 ug/mL (ref 50.0–100.0)

## 2016-11-10 LAB — PROTIME-INR
INR: 1.27
PROTHROMBIN TIME: 16 s — AB (ref 11.4–15.2)

## 2016-11-10 NOTE — ED Triage Notes (Signed)
Patient states he was taking and shower and woke up on floor in bath tub. Patient's wife states that she heard pt fall and found him in tub. Wife states patient hit his head. Patient is on warfarin. No visible injury to back of head.

## 2016-11-10 NOTE — Discharge Instructions (Signed)
Follow-up with your doctor this week for recheck. 

## 2016-11-10 NOTE — ED Provider Notes (Signed)
[facility] DEPT Provider Note   CSN: [id number] Arrival date & time: 11/10/16  1351     History   Chief Complaint Chief Complaint  Patient presents with  . Loss of Consciousness    HPI Eric [name] is a 60 y.o. male.  Patient has history of seizures. He has 2 seizures a month. He was in the shower when he had a syncopal episode. Patient is back to his normal self. He did not   The history is provided by the patient and a relative. No language interpreter was used.  Loss of Consciousness   This is a recurrent problem. The current episode started 6 to 12 hours ago. The problem occurs rarely. The problem has been resolved. He lost consciousness for a period of 1 to 5 minutes. Associated with: taking a shower. Pertinent negatives include abdominal pain, back pain, chest pain, congestion, fever, headaches and seizures. He has tried nothing for the symptoms.    Past Medical History:  Diagnosis Date  . Hypercholesteremia   . Seizures (HCC)     There are no active problems to display for this patient.   History reviewed. No pertinent surgical history.     Home Medications    Prior to Admission medications   Medication Sig Start Date End Date Taking? Authorizing Provider  carbamazepine (TEGRETOL) 200 MG tablet Take 200 mg by mouth 3 (three) times daily.    Yes Historical Provider, MD  carbidopa-levodopa (SINEMET IR) 25-100 MG tablet Take 1 tablet by mouth 3 (three) times daily.   Yes Historical Provider, MD  divalproex (DEPAKOTE ER) 500 MG 24 hr tablet Take 1,000 mg by mouth 2 (two) times daily.   Yes Historical Provider, MD  methocarbamol (ROBAXIN) 750 MG tablet Take 750 mg by mouth 2 (two) times daily.   Yes Historical Provider, MD  primidone (MYSOLINE) 250 MG tablet Take 1,000 mg by mouth at bedtime.   Yes Historical Provider, MD  simvastatin (ZOCOR) 40 MG tablet Take 20 mg by mouth at bedtime.    Yes Historical Provider, MD  Vitamin D, Ergocalciferol, (DRISDOL)  50000 UNITS CAPS capsule Take 50,000 Units by mouth every Thursday.   Yes Historical Provider, MD  warfarin (COUMADIN) 5 MG tablet Take 5 mg by mouth daily. 10/30/16  Yes Historical Provider, MD  sildenafil (VIAGRA) 100 MG tablet Take 100 mg by mouth daily as needed for erectile dysfunction.    Historical Provider, MD    Family History No family history on file.  Social History Social History  Substance Use Topics  . Smoking status: Never Smoker  . Smokeless tobacco: Never Used  . Alcohol use No     Allergies   Patient has no known allergies.   Review of Systems Review of Systems  Constitutional: Negative for appetite change, fatigue and fever.  HENT: Negative for congestion, ear discharge and sinus pressure.   Eyes: Negative for discharge.  Respiratory: Negative for cough.   Cardiovascular: Positive for syncope. Negative for chest pain.  Gastrointestinal: Negative for abdominal pain and diarrhea.  Genitourinary: Negative for frequency and hematuria.  Musculoskeletal: Negative for back pain.  Skin: Negative for rash.  Neurological: Negative for seizures and headaches.  Psychiatric/Behavioral: Negative for hallucinations.     Physical Exam Updated Vital Signs BP 125/71   Pulse 64   Resp 14   Ht 5\' 10"  (1.778 m)   Wt 224 lb (101.6 kg)   SpO2 100%   BMI 32.14 kg/m   Physical Exam  Constitutional:  He is oriented to person, place, and time. He appears well-developed.  HENT:  Head: Normocephalic.  Eyes: Conjunctivae are normal.  Neck: No tracheal deviation present.  Cardiovascular:  No murmur heard. Musculoskeletal: Normal range of motion.  Neurological: He is oriented to person, place, and time.  Skin: Skin is warm.  Psychiatric: He has a normal mood and affect.     ED Treatments / Results  Labs (all labs ordered are listed, but only abnormal results are displayed) Labs Reviewed  CBC WITH DIFFERENTIAL/PLATELET - Abnormal; Notable for the following:        Result Value   RBC 3.30 (*)    Hemoglobin 11.3 (*)    HCT 33.8 (*)    MCV 102.4 (*)    MCH 34.2 (*)    Monocytes Absolute 1.7 (*)    All other components within normal limits  COMPREHENSIVE METABOLIC PANEL - Abnormal; Notable for the following:    Calcium 8.4 (*)    Total Protein 6.4 (*)    Albumin 2.9 (*)    ALT 8 (*)    Alkaline Phosphatase 37 (*)    All other components within normal limits  PROTIME-INR - Abnormal; Notable for the following:    Prothrombin Time 16.0 (*)    All other components within normal limits  I-STAT CHEM 8, ED - Abnormal; Notable for the following:    Hemoglobin 11.2 (*)    HCT 33.0 (*)    All other components within normal limits  VALPROIC ACID LEVEL  I-STAT TROPOININ, ED    EKG  EKG Interpretation  Date/Time:  Sunday November 10 2016 14:11:31 EST Ventricular Rate:  62 PR Interval:    QRS Duration: 79 QT Interval:  372 QTC Calculation: 378 R Axis:   -8 Text Interpretation:  Sinus rhythm Low voltage, precordial leads Abnormal R-wave progression, early transition Baseline wander in lead(s) V1 Confirmed by Kahiau Schewe  MD, Shulem Mader [phone]([id number]) on 11/10/2016 3:29:29 PM       Radiology Dg Chest 2 View  Result Date: 11/10/2016 CLINICAL DATA:  Fall, seizure activity EXAM: CHEST  2 VIEW COMPARISON:  02/25/2016 FINDINGS: The heart size and mediastinal contours are within normal limits. Both lungs are clear. The visualized skeletal structures are unremarkable. IMPRESSION: No active cardiopulmonary disease. Electronically Signed   By: Judie PetitM.  Shick M.D.   On: 11/10/2016 14:54   Ct Head Wo Contrast  Result Date: 11/10/2016 CLINICAL DATA:  60 year old male with loss of consciousness while in the shower. Found down. Injury to the head. On warfarin. History of seizures. EXAM: CT HEAD WITHOUT CONTRAST CT CERVICAL SPINE WITHOUT CONTRAST TECHNIQUE: Multidetector CT imaging of the head and cervical spine was performed following the standard protocol without intravenous  contrast. Multiplanar CT image reconstructions of the cervical spine were also generated. COMPARISON:  Head CT and cervical spine CT 10/26/2011. FINDINGS: CT HEAD FINDINGS Brain: Mild cerebral atrophy. Patchy areas of decreased attenuation are noted throughout the deep and periventricular white matter of the cerebral hemispheres bilaterally, compatible with mild chronic microvascular ischemic disease. No evidence of acute infarction, hemorrhage, hydrocephalus, extra-axial collection or mass lesion/mass effect. Vascular: No hyperdense vessel or unexpected calcification. Skull: Normal. Negative for fracture or focal lesion. Sinuses/Orbits: Multifocal mucosal thickening throughout the paranasal sinuses, most severe in the posterior aspect of the right frontal sinus. No air-fluid levels. Other: None. CT CERVICAL SPINE FINDINGS Alignment: Normal. Skull base and vertebrae: No acute fracture. No primary bone lesion or focal pathologic process. Soft tissues and spinal canal: No  prevertebral fluid or swelling. No visible canal hematoma. Disc levels: Mild multilevel degenerative disc disease ease, most pronounced at C5-C6. Upper chest: Negative. Other: None. IMPRESSION: 1. No evidence of significant acute traumatic injury to the skull, brain or cervical spine. 2. Mild cerebral atrophy with mild chronic microvascular ischemic changes in the cerebral white matter, as above. 3. Mild multilevel degenerative disc disease in the cervical spine. 4. Mild paranasal sinus disease, without acute features, as above. Electronically Signed   By: Trudie Reedaniel  Entrikin M.D.   On: 11/10/2016 15:08   Ct Cervical Spine Wo Contrast  Result Date: 11/10/2016 CLINICAL DATA:  60 year old male with loss of consciousness while in the shower. Found down. Injury to the head. On warfarin. History of seizures. EXAM: CT HEAD WITHOUT CONTRAST CT CERVICAL SPINE WITHOUT CONTRAST TECHNIQUE: Multidetector CT imaging of the head and cervical spine was performed  following the standard protocol without intravenous contrast. Multiplanar CT image reconstructions of the cervical spine were also generated. COMPARISON:  Head CT and cervical spine CT 10/26/2011. FINDINGS: CT HEAD FINDINGS Brain: Mild cerebral atrophy. Patchy areas of decreased attenuation are noted throughout the deep and periventricular white matter of the cerebral hemispheres bilaterally, compatible with mild chronic microvascular ischemic disease. No evidence of acute infarction, hemorrhage, hydrocephalus, extra-axial collection or mass lesion/mass effect. Vascular: No hyperdense vessel or unexpected calcification. Skull: Normal. Negative for fracture or focal lesion. Sinuses/Orbits: Multifocal mucosal thickening throughout the paranasal sinuses, most severe in the posterior aspect of the right frontal sinus. No air-fluid levels. Other: None. CT CERVICAL SPINE FINDINGS Alignment: Normal. Skull base and vertebrae: No acute fracture. No primary bone lesion or focal pathologic process. Soft tissues and spinal canal: No prevertebral fluid or swelling. No visible canal hematoma. Disc levels: Mild multilevel degenerative disc disease ease, most pronounced at C5-C6. Upper chest: Negative. Other: None. IMPRESSION: 1. No evidence of significant acute traumatic injury to the skull, brain or cervical spine. 2. Mild cerebral atrophy with mild chronic microvascular ischemic changes in the cerebral white matter, as above. 3. Mild multilevel degenerative disc disease in the cervical spine. 4. Mild paranasal sinus disease, without acute features, as above. Electronically Signed   By: Trudie Reedaniel  Entrikin M.D.   On: 11/10/2016 15:08    Procedures Procedures (including critical care time)  Medications Ordered in ED Medications - No data to display   Initial Impression / Assessment and Plan / ED Course  I have reviewed the triage vital signs and the nursing notes.  Pertinent labs & imaging results that were available  during my care of the patient were reviewed by me and considered in my medical decision making (see chart for details).  Clinical Course     Patient had a couple episode in the shower. Most likely had a seizure. He has about 2 seizures a month. Lab work including Depakote level unremarkable CT head neck unremarkable. Patient discharged home for follow-up with his doctor  Final Clinical Impressions(s) / ED Diagnoses   Final diagnoses:  Syncope and collapse    New Prescriptions New Prescriptions   No medications on file     Bethann BerkshireJoseph Nerissa Constantin, MD 11/10/16 1535

## 2017-03-09 HISTORY — PX: IMPLANTATION VAGAL NERVE STIMULATOR: SUR692

## 2017-08-13 ENCOUNTER — Telehealth: Payer: Self-pay | Admitting: Physician Assistant

## 2017-08-13 NOTE — Telephone Encounter (Signed)
[facility] on 08/25/17 with Azalee CourseHao Meng, PA.  Records put with Hao's schedule for 08/25/17. lp

## 2017-08-25 ENCOUNTER — Ambulatory Visit: Payer: Medicare Other | Admitting: Physician Assistant

## 2017-08-29 ENCOUNTER — Telehealth: Payer: Self-pay | Admitting: Physician Assistant

## 2017-08-29 NOTE — Telephone Encounter (Signed)
Received incoming records from [facility] for upcoming appointment on 09/09/17 @ 2:30pm with Azalee Course. Records given to [facility] in Medical Records. 08/29/17 ab

## 2017-09-09 ENCOUNTER — Ambulatory Visit (INDEPENDENT_AMBULATORY_CARE_PROVIDER_SITE_OTHER): Payer: Non-veteran care | Admitting: Physician Assistant

## 2017-09-09 ENCOUNTER — Encounter: Payer: Self-pay | Admitting: Physician Assistant

## 2017-09-09 VITALS — BP 139/86 | HR 45 | Ht 70.0 in | Wt 196.0 lb

## 2017-09-09 DIAGNOSIS — R0789 Other chest pain: Secondary | ICD-10-CM

## 2017-09-09 DIAGNOSIS — Z8679 Personal history of other diseases of the circulatory system: Secondary | ICD-10-CM | POA: Diagnosis not present

## 2017-09-09 DIAGNOSIS — R001 Bradycardia, unspecified: Secondary | ICD-10-CM | POA: Diagnosis not present

## 2017-09-09 DIAGNOSIS — Z9889 Other specified postprocedural states: Secondary | ICD-10-CM

## 2017-09-09 DIAGNOSIS — I10 Essential (primary) hypertension: Secondary | ICD-10-CM | POA: Diagnosis not present

## 2017-09-09 DIAGNOSIS — R569 Unspecified convulsions: Secondary | ICD-10-CM

## 2017-09-09 NOTE — Progress Notes (Signed)
Cardiology Office Note    Date:  09/11/2017   ID:  Eric [name], DOB [birth year], MRN [id number]  PCP:  System, Pcp Not In  [facility] Complaint  Patient presents with  . New Patient (Initial Visit)    case discussed with DOD Dr. Duke Salvia    History of Present Illness:  Eric [name] is a 61 y.o. male with PMH of HLD, essential tremor, Parkinson's disease, atrial flutter diagnosed in September 2017, and seizure. He follows [facility] in [place] for primary care issue. He also has an electrophysiologist at [facility] in [place]. He was initially placed on Coumadin, however he underwent atrial flutter ablation procedure on 11/04/2016. His anticoagulation was discontinued a month after the procedure due to low CHA2DS2-VASC score 0. In April, he had implantation of vagal nerve stimulator for his seizure. According to his wife, his heart rate has always been slow. Prior to the ablation procedure, his heart rate was in the 40s, after the ablation, his heart rate increased to 70s. However since vagal nerve stimulator was implanted, his heart rate many stayed in the 40s. However according to previous electrophysiology note, his heart rate was also in the 30s and 40s when he saw his electrophysiologist back in March. He was recently seen by neurology service,  Due to concern about his significant bradycardia, he was referred to cardiology service for further evaluation. According to the referring note, his electrophysiologist do not have a schedule opening in a reasonable time period, therefore he was referred to outside facility for cardiac evaluation.  His only symptom is chronic fatigue which has been going on for the past several years. He denies any dizziness, he denies any blurred vision or feeling passing out. He does not have any lower extremity edema, orthopnea or PND. He is quite confused why he needed to be seen by cardiology service today. I informed him it  is to assess his bradycardia and determine whether his symptom is correlated with bradycardia. However at this time, there is no definitive correlation between his chronic fatigue which has been going on for several years with his bradycardia. Given lack of symptom, we do not recommend any pacemaker at this time. He will need to follow-up with his primary care provider in 2-3 month. He does have occasional chest discomfort since implantation of vagal nerve stimulator in April, he says it is not associated with exertion and last roughly 8 seconds. He is not a very good candidate for stress testing at this time and the without clear anginal symptom, he would not be a good candidate for invasive workup either. His bradycardia precluded from [facility] which can further worsens the bradycardia. He cannot keep up with the treadmill.     Past Medical History:  Diagnosis Date  . Hypercholesteremia   . Seizures (HCC)     Past Surgical History:  Procedure Laterality Date  . IMPLANTATION VAGAL NERVE STIMULATOR  03/2017    Current Medications: Outpatient Medications Prior to Visit  Medication Sig Dispense Refill  . carbamazepine (TEGRETOL) 200 MG tablet Take 200 mg by mouth 3 (three) times daily.     . carbidopa-levodopa (SINEMET IR) 25-100 MG tablet Take 1 tablet by mouth 3 (three) times daily.    . divalproex (DEPAKOTE ER) 500 MG 24 hr tablet Take 1,000 mg by mouth 2 (two) times daily.    . sildenafil (VIAGRA) 100 MG tablet Take 100 mg by mouth daily as needed  for erectile dysfunction.    . simvastatin (ZOCOR) 40 MG tablet Take 20 mg by mouth at bedtime.     . Vitamin D, Ergocalciferol, (DRISDOL) 50000 UNITS CAPS capsule Take 50,000 Units by mouth every Thursday.    . warfarin (COUMADIN) 5 MG tablet Take 5 mg by mouth daily.  0  . methocarbamol (ROBAXIN) 750 MG tablet Take 750 mg by mouth 2 (two) times daily.    . primidone (MYSOLINE) 250 MG tablet Take 1,000 mg by mouth at bedtime.     No  facility-administered medications prior to visit.      Allergies:   Patient has no known allergies.   Social History   Social History  . Marital status: Married    Spouse name: N/A  . Number of children: N/A  . Years of education: N/A   Social History Main Topics  . Smoking status: Never Smoker  . Smokeless tobacco: Never Used  . Alcohol use No  . Drug use: No  . Sexual activity: Not Asked   Other Topics Concern  . None   Social History Narrative  . None     Family History:  The patient's family history includes Heart attack in his brother; Heart disease in his mother; Heart failure in his father; Stroke in his father.   ROS:   Please see the history of present illness.    ROS All other systems reviewed and are negative.   PHYSICAL EXAM:   VS:  BP 139/86 (BP Location: Left Arm, Patient Position: Sitting, Cuff Size: Normal)   Pulse (!) 45   Ht  (1.778 m)   Wt 196 lb (88.9 kg)   BMI 28.12 kg/m    GEN: Well nourished, well developed, in no acute distress  HEENT: normal  Neck: no JVD, carotid bruits, or masses Cardiac: RRR; no murmurs, rubs, or gallops,no edema  Respiratory:  clear to auscultation bilaterally, normal work of breathing GI: soft, nontender, nondistended, + BS MS: no deformity or atrophy  Skin: warm and dry, no rash Neuro:  Alert and Oriented x 3, Strength and sensation are intact Psych: euthymic mood, full affect  Wt Readings from Last 3 Encounters:  09/09/17 196 lb (88.9 kg)  11/10/16 224 lb (101.6 kg)  02/25/16 260 lb (117.9 kg)      Studies/Labs Reviewed:   EKG:  EKG is ordered today.  The ekg ordered today demonstrates Sinus bradycardia, heart rate 43, otherwise no specific ST-T wave changes  Recent Labs: 11/10/2016: ALT 8; BUN 6; Creatinine, Ser 1.00; Hemoglobin 11.2; Platelets 194; Potassium 3.8; Sodium 141   Lipid Panel No results found for: CHOL, TRIG, HDL, CHOLHDL, VLDL, LDLCALC, LDLDIRECT  Additional studies/ records that  were reviewed today include:   Outside record from EP service in [facility]    ASSESSMENT:    1. Bradycardia   2. Essential hypertension   3. S/P ablation of atrial flutter   4. Seizure (HCC)   5. Atypical chest pain      PLAN:  In order of problems listed above:  1. Bradycardia: He has been having persistent bradycardia with heart rate in the 40s since earlier this year. Although per family, the implantation of vagal nerve stimulator made it worse. He has not had any change in his functional ability. He denies any dizziness, blurred vision or feeling of passing out. He has chronic fatigue which has been going on for several years. His heart rate was actually in the 40s when he  saw his primary cardiologist in March. I have discussed the case with DOD Dr. Duke Salvia, at this time we do not recommend any pacemaker implantation due to lack of clear correlation between his symptom and bradycardia.  2. Atypical chest pain: Only last 8 seconds at a time, has been going on since April of this year, no increase in frequency or duration. He is not a good candidate for stress testing, unless there is clear signs of angina, I would not recommend any invasive workup at this time.  3. Atrial flutter s/p ablation at [facility] in November 2017: No obvious sign of recurrence  4. Seizure s/p vagal nerve stimulator in April 2018    Medication Adjustments/Labs and Tests Ordered: Current medicines are reviewed at length with the patient today.  Concerns regarding medicines are outlined above.  Medication changes, Labs and Tests ordered today are listed in the Patient Instructions below. Patient Instructions  Your physician recommends that you continue on your current medications as directed. Please refer to the Current Medication list given to you today.  Your physician recommends that you schedule a follow-up appointment with cardiologist @ [facility] in next 2-3 months  ** if your chest pain  last longer or becomes more frequent or occurs with exertion, please contact cardiologist    Signed, Azalee Course, PA  09/11/2017 1:33 PM    [facility] [address] [place], [place], [place]  [address] Phone: [phone]; Fax: [phone]

## 2017-09-09 NOTE — Patient Instructions (Addendum)
Your physician recommends that you continue on your current medications as directed. Please refer to the Current Medication list given to you today.  Your physician recommends that you schedule a follow-up appointment with cardiologist @ [facility] in next 2-3 months  ** if your chest pain last longer or becomes more frequent or occurs with exertion, please contact cardiologist

## 2017-09-11 ENCOUNTER — Encounter: Payer: Self-pay | Admitting: Physician Assistant

## 2018-05-28 DIAGNOSIS — I4891 Unspecified atrial fibrillation: Secondary | ICD-10-CM | POA: Diagnosis not present

## 2018-06-09 ENCOUNTER — Emergency Department (HOSPITAL_COMMUNITY)
Admission: EM | Admit: 2018-06-09 | Discharge: 2018-06-09 | Disposition: A | Discharge status: Home / Self Care | Payer: Medicare Other | Attending: Emergency Medicine | Admitting: Emergency Medicine

## 2018-06-09 ENCOUNTER — Other Ambulatory Visit: Payer: Self-pay

## 2018-06-09 ENCOUNTER — Emergency Department (HOSPITAL_COMMUNITY): Payer: Medicare Other

## 2018-06-09 ENCOUNTER — Encounter (HOSPITAL_COMMUNITY): Payer: Self-pay | Admitting: Emergency Medicine

## 2018-06-09 DIAGNOSIS — Z79899 Other long term (current) drug therapy: Secondary | ICD-10-CM | POA: Insufficient documentation

## 2018-06-09 DIAGNOSIS — R531 Weakness: Secondary | ICD-10-CM | POA: Diagnosis not present

## 2018-06-09 DIAGNOSIS — R4182 Altered mental status, unspecified: Secondary | ICD-10-CM | POA: Diagnosis not present

## 2018-06-09 DIAGNOSIS — R0602 Shortness of breath: Secondary | ICD-10-CM | POA: Diagnosis not present

## 2018-06-09 DIAGNOSIS — M6281 Muscle weakness (generalized): Secondary | ICD-10-CM | POA: Diagnosis not present

## 2018-06-09 DIAGNOSIS — Z7901 Long term (current) use of anticoagulants: Secondary | ICD-10-CM | POA: Diagnosis not present

## 2018-06-09 HISTORY — DX: Unspecified atrial fibrillation: I48.91

## 2018-06-09 LAB — COMPREHENSIVE METABOLIC PANEL
ALK PHOS: 33 U/L — AB (ref 38–126)
ALT: 6 U/L (ref 0–44)
ANION GAP: 8 (ref 5–15)
AST: 15 U/L (ref 15–41)
Albumin: 3.5 g/dL (ref 3.5–5.0)
BUN: 14 mg/dL (ref 8–23)
CALCIUM: 9.2 mg/dL (ref 8.9–10.3)
CO2: 31 mmol/L (ref 22–32)
CREATININE: 0.98 mg/dL (ref 0.61–1.24)
Chloride: 101 mmol/L (ref 98–111)
Glucose, Bld: 110 mg/dL — ABNORMAL HIGH (ref 70–99)
Potassium: 4.2 mmol/L (ref 3.5–5.1)
Sodium: 140 mmol/L (ref 135–145)
TOTAL PROTEIN: 7.5 g/dL (ref 6.5–8.1)
Total Bilirubin: 0.7 mg/dL (ref 0.3–1.2)

## 2018-06-09 LAB — URINALYSIS, ROUTINE W REFLEX MICROSCOPIC
GLUCOSE, UA: NEGATIVE mg/dL
HGB URINE DIPSTICK: NEGATIVE
KETONES UR: 15 mg/dL — AB
Leukocytes, UA: NEGATIVE
NITRITE: NEGATIVE
PH: 6.5 (ref 5.0–8.0)
PROTEIN: NEGATIVE mg/dL
SPECIFIC GRAVITY, URINE: 1.02 (ref 1.005–1.030)

## 2018-06-09 LAB — CBC WITH DIFFERENTIAL/PLATELET
Basophils Absolute: 0 10*3/uL (ref 0.0–0.1)
Basophils Relative: 0 %
EOS ABS: 0.1 10*3/uL (ref 0.0–0.7)
Eosinophils Relative: 1 %
HEMATOCRIT: 34.9 % — AB (ref 39.0–52.0)
HEMOGLOBIN: 11.5 g/dL — AB (ref 13.0–17.0)
LYMPHS ABS: 3.2 10*3/uL (ref 0.7–4.0)
Lymphocytes Relative: 40 %
MCH: 35.6 pg — AB (ref 26.0–34.0)
MCHC: 33 g/dL (ref 30.0–36.0)
MCV: 108 fL — ABNORMAL HIGH (ref 78.0–100.0)
MONO ABS: 0.7 10*3/uL (ref 0.1–1.0)
Monocytes Relative: 9 %
NEUTROS ABS: 4.1 10*3/uL (ref 1.7–7.7)
NEUTROS PCT: 50 %
Platelets: 166 10*3/uL (ref 150–400)
RBC: 3.23 MIL/uL — ABNORMAL LOW (ref 4.22–5.81)
RDW: 14.9 % (ref 11.5–15.5)
WBC: 8.2 10*3/uL (ref 4.0–10.5)

## 2018-06-09 LAB — CARBAMAZEPINE LEVEL, TOTAL: Carbamazepine Lvl: 4.9 ug/mL (ref 4.0–12.0)

## 2018-06-09 LAB — TROPONIN I: Troponin I: <0.03 ng/mL (ref <0.03)

## 2018-06-09 LAB — PROTIME-INR
INR: 0.99
PROTHROMBIN TIME: 13 s (ref 11.4–15.2)

## 2018-06-09 LAB — VALPROIC ACID LEVEL: Valproic Acid Lvl: 121 ug/mL — ABNORMAL HIGH (ref 50.0–100.0)

## 2018-06-09 LAB — CBG MONITORING, ED: Glucose-Capillary: 107 mg/dL — ABNORMAL HIGH (ref 70–99)

## 2018-06-09 NOTE — ED Provider Notes (Signed)
[facility] EMERGENCY DEPARTMENT Provider Note   CSN: [id number] Arrival date & time: 06/09/18  1722     History   Chief Complaint Chief Complaint  Patient presents with  . Weakness    HPI Eric [name] is a 62 y.o. male.  The history is provided by the patient, a relative and a caregiver. The history is limited by the condition of the patient (Hx confusion x2 months).  Weakness    Pt was seen at 1740. Per pt's family and pt: Pt has been having frequent falls, generalized weakness, and increasing confusion over the past 2 months. Pt is currently being worked up by his PMD and Neuro MD regarding causes for this. No definitive dx at this time. Pt's family states pt has not been taking PO well for the past several days, and his urine output has been lower than usual. Pt's family states they called the [place] and were told to bring him to the ED "to check for possible dehydration." Denies any vomiting/diarrhea, no cough, no fevers, no focal motor weakness, no syncope, no CP, no abd pain.  Past Medical History:  Diagnosis Date  . A-fib (HCC)   . Hypercholesteremia   . Seizures (HCC)     There are no active problems to display for this patient.   Past Surgical History:  Procedure Laterality Date  . APPENDECTOMY    . CARDIAC ELECTROPHYSIOLOGY STUDY AND ABLATION    . IMPLANTATION VAGAL NERVE STIMULATOR  03/2017        Home Medications    Prior to Admission medications   Medication Sig Start Date End Date Taking? Authorizing Provider  carbamazepine (TEGRETOL) 200 MG tablet Take 200 mg by mouth 3 (three) times daily.     [provider]  carbidopa-levodopa (SINEMET IR) 25-100 MG tablet Take 1 tablet by mouth 3 (three) times daily.    [provider]  divalproex (DEPAKOTE ER) 500 MG 24 hr tablet Take 1,000 mg by mouth 2 (two) times daily.    [provider]  primidone (MYSOLINE) 250 MG tablet Take 250 mg by mouth 2 (two) times daily.    [provider]  sildenafil (VIAGRA) 100 MG tablet Take 100 mg by mouth daily as needed for erectile dysfunction.    [provider]  simvastatin (ZOCOR) 40 MG tablet Take 20 mg by mouth at bedtime.     [provider]  vitamin B-12 (CYANOCOBALAMIN) 1000 MCG tablet Take 1,000 mcg by mouth daily.    [provider]  Vitamin D, Ergocalciferol, (DRISDOL) 50000 UNITS CAPS capsule Take 50,000 Units by mouth every Thursday.    [provider]  warfarin (COUMADIN) 5 MG tablet Take 5 mg by mouth daily. 10/30/16   [provider]    Family History Family History  Problem Relation Age of Onset  . Heart disease Mother        pacemaker in his 64s  . Heart failure Father   . Stroke Father   . Heart attack Brother        52s    Social History Social History   Tobacco Use  . Smoking status: Never Smoker  . Smokeless tobacco: Never Used  Substance Use Topics  . Alcohol use: No  . Drug use: No     Allergies   Patient has no known allergies.   Review of Systems Review of Systems  Unable to perform ROS: Mental status change  Neurological: Positive for weakness.  Physical Exam Updated Vital Signs BP 116/76 (BP Location: Right Arm)   Pulse 61   Temp 98.4 F (36.9 C) (Oral)   Resp 17   Ht 5\' 11"  (1.803 m)   Wt 74.8 kg (165 lb)   SpO2 100%   BMI 23.01 kg/m      17:40 Orthostatic Vital Signs KS  Orthostatic Lying   BP- Lying: 117/69   Pulse- Lying: 70       Orthostatic Sitting  BP- Sitting: 127/84   Pulse- Sitting: 61       Orthostatic Standing at 0 minutes  BP- Standing at 0 minutes: 121/82   Pulse- Standing at 0 minutes: 91     Physical Exam 1745: Physical examination:  Nursing notes reviewed; Vital signs and O2 SAT reviewed;  Constitutional: Well developed, Well nourished, In no acute distress; Head:  Normocephalic, atraumatic; Eyes: EOMI, PERRL, No scleral icterus; ENMT: Mouth and pharynx normal, Mucous membranes  dry; Neck: Supple, Full range of motion, No lymphadenopathy; Cardiovascular: Regular rate and rhythm, No gallop; Respiratory: Breath sounds clear & equal bilaterally, No wheezes.  Speaking full sentences with ease, Normal respiratory effort/excursion; Chest: Nontender, Movement normal; Abdomen: Soft, Nontender, Nondistended, Normal bowel sounds; Genitourinary: No CVA tenderness; Extremities: Peripheral pulses normal, No deformity. +tr pedal edema bilat. No calf tenderness.; Neuro: Awake, alert, confused re: events. No facial droop. Major CN grossly intact.  Speech clear. Grips equal. Strength 5/5 equal bilat UE's and LE's without apparent gross focal motor deficits in extremities. Pt does not appear to comprehend cerebellar testing without repetitive explanation and encouragement..; Skin: Color pale, Warm, Dry.; Psych:  Affect flat.    ED Treatments / Results  Labs (all labs ordered are listed, but only abnormal results are displayed)   EKG EKG Interpretation  Date/Time:  Tuesday June 09 2018 17:38:51 EDT Ventricular Rate:  53 PR Interval:    QRS Duration: 95 QT Interval:  391 QTC Calculation: 367 R Axis:   0 Text Interpretation:  Sinus rhythm Ventricular premature complex Borderline low voltage, extremity leads Minimal ST depression, diffuse leads Baseline wander When compared with ECG of 11/10/2016 No significant change was found Confirmed by Samuel Jester [phone]) on 06/09/2018 5:45:54 PM   Radiology   Procedures Procedures (including critical care time)  Medications Ordered in ED Medications - No data to display   Initial Impression / Assessment and Plan / ED Course  I have reviewed the triage vital signs and the nursing notes.  Pertinent labs & imaging results that were available during my care of the patient were reviewed by me and considered in my medical decision making (see chart for details).  MDM Reviewed: previous chart, nursing note and vitals Reviewed previous:  labs and ECG Interpretation: labs, ECG, x-ray and CT scan    Results for orders placed or performed during the hospital encounter of 06/09/18  Urinalysis, Routine w reflex microscopic  Result Value Ref Range   Color, Urine ORANGE (A) YELLOW   APPearance CLEAR CLEAR   Specific Gravity, Urine 1.020 1.005 - 1.030   pH 6.5 5.0 - 8.0   Glucose, UA NEGATIVE NEGATIVE mg/dL   Hgb urine dipstick NEGATIVE NEGATIVE   Bilirubin Urine SMALL (A) NEGATIVE   Ketones, ur 15 (A) NEGATIVE mg/dL   Protein, ur NEGATIVE NEGATIVE mg/dL   Nitrite NEGATIVE NEGATIVE   Leukocytes, UA NEGATIVE NEGATIVE  CBC with Differential  Result Value Ref Range   WBC 8.2 4.0 - 10.5 K/uL   RBC 3.23 (L) 4.22 -  5.81 MIL/uL   Hemoglobin 11.5 (L) 13.0 - 17.0 g/dL   HCT 69.6 (L) 29.5 - 28.4 %   MCV 108.0 (H) 78.0 - 100.0 fL   MCH 35.6 (H) 26.0 - 34.0 pg   MCHC 33.0 30.0 - 36.0 g/dL   RDW 13.2 44.0 - 10.2 %   Platelets 166 150 - 400 K/uL   Neutrophils Relative % 50 %   Neutro Abs 4.1 1.7 - 7.7 K/uL   Lymphocytes Relative 40 %   Lymphs Abs 3.2 0.7 - 4.0 K/uL   Monocytes Relative 9 %   Monocytes Absolute 0.7 0.1 - 1.0 K/uL   Eosinophils Relative 1 %   Eosinophils Absolute 0.1 0.0 - 0.7 K/uL   Basophils Relative 0 %   Basophils Absolute 0.0 0.0 - 0.1 K/uL  Troponin I  Result Value Ref Range   Troponin I <0.03 <0.03 ng/mL  Comprehensive metabolic panel  Result Value Ref Range   Sodium 140 135 - 145 mmol/L   Potassium 4.2 3.5 - 5.1 mmol/L   Chloride 101 98 - 111 mmol/L   CO2 31 22 - 32 mmol/L   Glucose, Bld 110 (H) 70 - 99 mg/dL   BUN 14 8 - 23 mg/dL   Creatinine, Ser 7.25 0.61 - 1.24 mg/dL   Calcium 9.2 8.9 - 36.6 mg/dL   Total Protein 7.5 6.5 - 8.1 g/dL   Albumin 3.5 3.5 - 5.0 g/dL   AST 15 15 - 41 U/L   ALT 6 0 - 44 U/L   Alkaline Phosphatase 33 (L) 38 - 126 U/L   Total Bilirubin 0.7 0.3 - 1.2 mg/dL   GFR calc non Af Amer >60 >60 mL/min   GFR calc Af Amer >60 >60 mL/min   Anion gap 8 5 - 15  Protime-INR    Result Value Ref Range   Prothrombin Time 13.0 11.4 - 15.2 seconds   INR 0.99   Valproic acid level  Result Value Ref Range   Valproic Acid Lvl 121 (H) 50.0 - 100.0 ug/mL  Carbamazepine level, total  Result Value Ref Range   Carbamazepine Lvl 4.9 4.0 - 12.0 ug/mL  CBG monitoring, ED  Result Value Ref Range   Glucose-Capillary 107 (H) 70 - 99 mg/dL   Dg Chest 2 View Result Date: 06/09/2018 CLINICAL DATA:  Weakness, short of breath EXAM: CHEST - 2 VIEW COMPARISON:  11/10/2016 FINDINGS: Left-sided nerve stimulator with ascending leads. No acute consolidation or effusion. Normal heart size. No pneumothorax. IMPRESSION: No active cardiopulmonary disease. Electronically Signed   By: Jasmine Pang M.D.   On: 06/09/2018 19:05   Ct Head Wo Contrast Result Date: 06/09/2018 CLINICAL DATA:  62 year old male with acute muscle weakness. EXAM: CT HEAD WITHOUT CONTRAST TECHNIQUE: Contiguous axial images were obtained from the base of the skull through the vertex without intravenous contrast. COMPARISON:  11/10/2016 and prior exams FINDINGS: Brain: No evidence of acute infarction, hemorrhage, hydrocephalus, extra-axial collection or mass lesion/mass effect. Atrophy again noted. Vascular: No hyperdense vessel or unexpected calcification. Skull: Normal. Negative for fracture or focal lesion. Sinuses/Orbits: No acute finding. Other: Unremarkable IMPRESSION: 1. No evidence of acute intracranial abnormality 2. Atrophy Electronically Signed   By: Harmon Pier M.D.   On: 06/09/2018 18:54    2000:  H/H per baseline. Pt not orthostatic on VS. Pt ambulated with walker with baseline, steady gait per family watching. NAD, resps easy. Calm/cooperative. Depakote level mildly elevated; doubt toxicity.   T/C returned from Tele Neuro Dr. Barnet Pall,  case discussed, including:  HPI, pertinent PM/SHx, VS/PE, dx testing, ED course and treatment:  Agrees not toxic level of depakote, no need for admission, have pt hold next depakote dose  then resume and f/u with PMD or Neuro MD for repeat level.  2030:  Workup otherwise reassuring. No clear indication for admission at this time. Dx and testing, as well as d/w Neuro MD, d/w pt and family.  Questions answered.  Verb understanding, agreeable to d/c home with outpt f/u.     Final Clinical Impressions(s) / ED Diagnoses   Final diagnoses:  None    ED Discharge Orders    None       Samuel JesterMcManus, Anmol Fleck, DO 06/14/18 [id number]

## 2018-06-09 NOTE — ED Notes (Signed)
Pt ambulated with walker (what he normally uses at home) without difficulty.

## 2018-06-09 NOTE — ED Triage Notes (Signed)
Family member states patient has had weakness and falling x 2 months. [place] and was told to [place] patient to ER for possible dehydration. Patient denies pain at triage.

## 2018-06-09 NOTE — Discharge Instructions (Addendum)
Your depakote level was slightly high when checked today. Hold your next depakote dose, then resume and take all your prescriptions as previously directed.  Call your regular medical doctor tomorrow to schedule a follow up appointment within the next 3 days for a re-check of your depakote level. Call your Neurologist tomorrow to schedule a follow up appointment within the next week.  Return to the Emergency Department immediately sooner if worsening.

## 2018-06-10 LAB — URINE CULTURE: CULTURE: NO GROWTH

## 2018-06-24 DIAGNOSIS — I4892 Unspecified atrial flutter: Secondary | ICD-10-CM | POA: Diagnosis not present

## 2018-06-24 DIAGNOSIS — G40909 Epilepsy, unspecified, not intractable, without status epilepticus: Secondary | ICD-10-CM | POA: Diagnosis not present

## 2018-06-24 DIAGNOSIS — G20 Parkinson's disease: Secondary | ICD-10-CM | POA: Diagnosis not present

## 2018-06-24 DIAGNOSIS — I10 Essential (primary) hypertension: Secondary | ICD-10-CM | POA: Diagnosis not present

## 2018-06-24 DIAGNOSIS — E785 Hyperlipidemia, unspecified: Secondary | ICD-10-CM | POA: Diagnosis not present

## 2018-06-24 DIAGNOSIS — R627 Adult failure to thrive: Secondary | ICD-10-CM | POA: Diagnosis not present

## 2018-06-24 DIAGNOSIS — F0280 Dementia in other diseases classified elsewhere without behavioral disturbance: Secondary | ICD-10-CM | POA: Diagnosis not present

## 2018-06-24 DIAGNOSIS — I2510 Atherosclerotic heart disease of native coronary artery without angina pectoris: Secondary | ICD-10-CM | POA: Diagnosis not present

## 2018-06-24 DIAGNOSIS — Z9181 History of falling: Secondary | ICD-10-CM | POA: Diagnosis not present

## 2018-06-29 DIAGNOSIS — F0280 Dementia in other diseases classified elsewhere without behavioral disturbance: Secondary | ICD-10-CM | POA: Diagnosis not present

## 2018-06-29 DIAGNOSIS — I10 Essential (primary) hypertension: Secondary | ICD-10-CM | POA: Diagnosis not present

## 2018-06-29 DIAGNOSIS — I2510 Atherosclerotic heart disease of native coronary artery without angina pectoris: Secondary | ICD-10-CM | POA: Diagnosis not present

## 2018-06-29 DIAGNOSIS — I4892 Unspecified atrial flutter: Secondary | ICD-10-CM | POA: Diagnosis not present

## 2018-06-29 DIAGNOSIS — G20 Parkinson's disease: Secondary | ICD-10-CM | POA: Diagnosis not present

## 2018-06-29 DIAGNOSIS — G40909 Epilepsy, unspecified, not intractable, without status epilepticus: Secondary | ICD-10-CM | POA: Diagnosis not present

## 2018-07-01 DIAGNOSIS — I4892 Unspecified atrial flutter: Secondary | ICD-10-CM | POA: Diagnosis not present

## 2018-07-01 DIAGNOSIS — G40909 Epilepsy, unspecified, not intractable, without status epilepticus: Secondary | ICD-10-CM | POA: Diagnosis not present

## 2018-07-01 DIAGNOSIS — I2510 Atherosclerotic heart disease of native coronary artery without angina pectoris: Secondary | ICD-10-CM | POA: Diagnosis not present

## 2018-07-01 DIAGNOSIS — F0280 Dementia in other diseases classified elsewhere without behavioral disturbance: Secondary | ICD-10-CM | POA: Diagnosis not present

## 2018-07-01 DIAGNOSIS — I10 Essential (primary) hypertension: Secondary | ICD-10-CM | POA: Diagnosis not present

## 2018-07-01 DIAGNOSIS — G20 Parkinson's disease: Secondary | ICD-10-CM | POA: Diagnosis not present

## 2018-07-08 DIAGNOSIS — G40909 Epilepsy, unspecified, not intractable, without status epilepticus: Secondary | ICD-10-CM | POA: Diagnosis not present

## 2018-07-08 DIAGNOSIS — F0280 Dementia in other diseases classified elsewhere without behavioral disturbance: Secondary | ICD-10-CM | POA: Diagnosis not present

## 2018-07-08 DIAGNOSIS — I4892 Unspecified atrial flutter: Secondary | ICD-10-CM | POA: Diagnosis not present

## 2018-07-08 DIAGNOSIS — I10 Essential (primary) hypertension: Secondary | ICD-10-CM | POA: Diagnosis not present

## 2018-07-08 DIAGNOSIS — I2510 Atherosclerotic heart disease of native coronary artery without angina pectoris: Secondary | ICD-10-CM | POA: Diagnosis not present

## 2018-07-08 DIAGNOSIS — G20 Parkinson's disease: Secondary | ICD-10-CM | POA: Diagnosis not present

## 2018-07-10 DIAGNOSIS — G20 Parkinson's disease: Secondary | ICD-10-CM | POA: Diagnosis not present

## 2018-07-10 DIAGNOSIS — I4892 Unspecified atrial flutter: Secondary | ICD-10-CM | POA: Diagnosis not present

## 2018-07-10 DIAGNOSIS — F0280 Dementia in other diseases classified elsewhere without behavioral disturbance: Secondary | ICD-10-CM | POA: Diagnosis not present

## 2018-07-10 DIAGNOSIS — I10 Essential (primary) hypertension: Secondary | ICD-10-CM | POA: Diagnosis not present

## 2018-07-10 DIAGNOSIS — G40909 Epilepsy, unspecified, not intractable, without status epilepticus: Secondary | ICD-10-CM | POA: Diagnosis not present

## 2018-07-10 DIAGNOSIS — I2510 Atherosclerotic heart disease of native coronary artery without angina pectoris: Secondary | ICD-10-CM | POA: Diagnosis not present

## 2018-07-13 DIAGNOSIS — I10 Essential (primary) hypertension: Secondary | ICD-10-CM | POA: Diagnosis not present

## 2018-07-13 DIAGNOSIS — I4892 Unspecified atrial flutter: Secondary | ICD-10-CM | POA: Diagnosis not present

## 2018-07-13 DIAGNOSIS — F0280 Dementia in other diseases classified elsewhere without behavioral disturbance: Secondary | ICD-10-CM | POA: Diagnosis not present

## 2018-07-13 DIAGNOSIS — G20 Parkinson's disease: Secondary | ICD-10-CM | POA: Diagnosis not present

## 2018-07-13 DIAGNOSIS — I2510 Atherosclerotic heart disease of native coronary artery without angina pectoris: Secondary | ICD-10-CM | POA: Diagnosis not present

## 2018-07-13 DIAGNOSIS — G40909 Epilepsy, unspecified, not intractable, without status epilepticus: Secondary | ICD-10-CM | POA: Diagnosis not present

## 2018-07-15 DIAGNOSIS — G20 Parkinson's disease: Secondary | ICD-10-CM | POA: Diagnosis not present

## 2018-07-15 DIAGNOSIS — I10 Essential (primary) hypertension: Secondary | ICD-10-CM | POA: Diagnosis not present

## 2018-07-15 DIAGNOSIS — I2510 Atherosclerotic heart disease of native coronary artery without angina pectoris: Secondary | ICD-10-CM | POA: Diagnosis not present

## 2018-07-15 DIAGNOSIS — F0280 Dementia in other diseases classified elsewhere without behavioral disturbance: Secondary | ICD-10-CM | POA: Diagnosis not present

## 2018-07-15 DIAGNOSIS — G40909 Epilepsy, unspecified, not intractable, without status epilepticus: Secondary | ICD-10-CM | POA: Diagnosis not present

## 2018-07-15 DIAGNOSIS — I4892 Unspecified atrial flutter: Secondary | ICD-10-CM | POA: Diagnosis not present

## 2018-07-20 DIAGNOSIS — G40909 Epilepsy, unspecified, not intractable, without status epilepticus: Secondary | ICD-10-CM | POA: Diagnosis not present

## 2018-07-20 DIAGNOSIS — I2510 Atherosclerotic heart disease of native coronary artery without angina pectoris: Secondary | ICD-10-CM | POA: Diagnosis not present

## 2018-07-20 DIAGNOSIS — F0280 Dementia in other diseases classified elsewhere without behavioral disturbance: Secondary | ICD-10-CM | POA: Diagnosis not present

## 2018-07-20 DIAGNOSIS — I10 Essential (primary) hypertension: Secondary | ICD-10-CM | POA: Diagnosis not present

## 2018-07-20 DIAGNOSIS — G20 Parkinson's disease: Secondary | ICD-10-CM | POA: Diagnosis not present

## 2018-07-20 DIAGNOSIS — I4892 Unspecified atrial flutter: Secondary | ICD-10-CM | POA: Diagnosis not present

## 2018-07-22 DIAGNOSIS — G40909 Epilepsy, unspecified, not intractable, without status epilepticus: Secondary | ICD-10-CM | POA: Diagnosis not present

## 2018-07-22 DIAGNOSIS — I10 Essential (primary) hypertension: Secondary | ICD-10-CM | POA: Diagnosis not present

## 2018-07-22 DIAGNOSIS — I2510 Atherosclerotic heart disease of native coronary artery without angina pectoris: Secondary | ICD-10-CM | POA: Diagnosis not present

## 2018-07-22 DIAGNOSIS — I4892 Unspecified atrial flutter: Secondary | ICD-10-CM | POA: Diagnosis not present

## 2018-07-22 DIAGNOSIS — F0280 Dementia in other diseases classified elsewhere without behavioral disturbance: Secondary | ICD-10-CM | POA: Diagnosis not present

## 2018-07-22 DIAGNOSIS — G20 Parkinson's disease: Secondary | ICD-10-CM | POA: Diagnosis not present

## 2018-07-27 DIAGNOSIS — I10 Essential (primary) hypertension: Secondary | ICD-10-CM | POA: Diagnosis not present

## 2018-07-27 DIAGNOSIS — I4892 Unspecified atrial flutter: Secondary | ICD-10-CM | POA: Diagnosis not present

## 2018-07-27 DIAGNOSIS — G20 Parkinson's disease: Secondary | ICD-10-CM | POA: Diagnosis not present

## 2018-07-27 DIAGNOSIS — G40909 Epilepsy, unspecified, not intractable, without status epilepticus: Secondary | ICD-10-CM | POA: Diagnosis not present

## 2018-07-27 DIAGNOSIS — F0280 Dementia in other diseases classified elsewhere without behavioral disturbance: Secondary | ICD-10-CM | POA: Diagnosis not present

## 2018-07-27 DIAGNOSIS — I2510 Atherosclerotic heart disease of native coronary artery without angina pectoris: Secondary | ICD-10-CM | POA: Diagnosis not present

## 2018-07-29 DIAGNOSIS — G20 Parkinson's disease: Secondary | ICD-10-CM | POA: Diagnosis not present

## 2018-07-29 DIAGNOSIS — I2510 Atherosclerotic heart disease of native coronary artery without angina pectoris: Secondary | ICD-10-CM | POA: Diagnosis not present

## 2018-07-29 DIAGNOSIS — F0280 Dementia in other diseases classified elsewhere without behavioral disturbance: Secondary | ICD-10-CM | POA: Diagnosis not present

## 2018-07-29 DIAGNOSIS — I4892 Unspecified atrial flutter: Secondary | ICD-10-CM | POA: Diagnosis not present

## 2018-07-29 DIAGNOSIS — I10 Essential (primary) hypertension: Secondary | ICD-10-CM | POA: Diagnosis not present

## 2018-07-29 DIAGNOSIS — G40909 Epilepsy, unspecified, not intractable, without status epilepticus: Secondary | ICD-10-CM | POA: Diagnosis not present

## 2018-08-03 DIAGNOSIS — I4892 Unspecified atrial flutter: Secondary | ICD-10-CM | POA: Diagnosis not present

## 2018-08-03 DIAGNOSIS — I2510 Atherosclerotic heart disease of native coronary artery without angina pectoris: Secondary | ICD-10-CM | POA: Diagnosis not present

## 2018-08-03 DIAGNOSIS — I10 Essential (primary) hypertension: Secondary | ICD-10-CM | POA: Diagnosis not present

## 2018-08-03 DIAGNOSIS — G20 Parkinson's disease: Secondary | ICD-10-CM | POA: Diagnosis not present

## 2018-08-03 DIAGNOSIS — G40909 Epilepsy, unspecified, not intractable, without status epilepticus: Secondary | ICD-10-CM | POA: Diagnosis not present

## 2018-08-03 DIAGNOSIS — F0280 Dementia in other diseases classified elsewhere without behavioral disturbance: Secondary | ICD-10-CM | POA: Diagnosis not present

## 2018-08-05 DIAGNOSIS — G40909 Epilepsy, unspecified, not intractable, without status epilepticus: Secondary | ICD-10-CM | POA: Diagnosis not present

## 2018-08-05 DIAGNOSIS — G20 Parkinson's disease: Secondary | ICD-10-CM | POA: Diagnosis not present

## 2018-08-05 DIAGNOSIS — I2510 Atherosclerotic heart disease of native coronary artery without angina pectoris: Secondary | ICD-10-CM | POA: Diagnosis not present

## 2018-08-05 DIAGNOSIS — F0280 Dementia in other diseases classified elsewhere without behavioral disturbance: Secondary | ICD-10-CM | POA: Diagnosis not present

## 2018-08-05 DIAGNOSIS — I4892 Unspecified atrial flutter: Secondary | ICD-10-CM | POA: Diagnosis not present

## 2018-08-05 DIAGNOSIS — I10 Essential (primary) hypertension: Secondary | ICD-10-CM | POA: Diagnosis not present

## 2018-08-14 DIAGNOSIS — I4892 Unspecified atrial flutter: Secondary | ICD-10-CM | POA: Diagnosis not present

## 2018-08-14 DIAGNOSIS — I10 Essential (primary) hypertension: Secondary | ICD-10-CM | POA: Diagnosis not present

## 2018-08-14 DIAGNOSIS — G20 Parkinson's disease: Secondary | ICD-10-CM | POA: Diagnosis not present

## 2018-08-14 DIAGNOSIS — G40909 Epilepsy, unspecified, not intractable, without status epilepticus: Secondary | ICD-10-CM | POA: Diagnosis not present

## 2018-08-14 DIAGNOSIS — F0280 Dementia in other diseases classified elsewhere without behavioral disturbance: Secondary | ICD-10-CM | POA: Diagnosis not present

## 2018-08-14 DIAGNOSIS — I2510 Atherosclerotic heart disease of native coronary artery without angina pectoris: Secondary | ICD-10-CM | POA: Diagnosis not present

## 2018-08-17 DIAGNOSIS — F0280 Dementia in other diseases classified elsewhere without behavioral disturbance: Secondary | ICD-10-CM | POA: Diagnosis not present

## 2018-08-17 DIAGNOSIS — I2510 Atherosclerotic heart disease of native coronary artery without angina pectoris: Secondary | ICD-10-CM | POA: Diagnosis not present

## 2018-08-17 DIAGNOSIS — G40909 Epilepsy, unspecified, not intractable, without status epilepticus: Secondary | ICD-10-CM | POA: Diagnosis not present

## 2018-08-17 DIAGNOSIS — I4892 Unspecified atrial flutter: Secondary | ICD-10-CM | POA: Diagnosis not present

## 2018-08-17 DIAGNOSIS — G20 Parkinson's disease: Secondary | ICD-10-CM | POA: Diagnosis not present

## 2018-08-17 DIAGNOSIS — I10 Essential (primary) hypertension: Secondary | ICD-10-CM | POA: Diagnosis not present

## 2018-08-19 DIAGNOSIS — I4892 Unspecified atrial flutter: Secondary | ICD-10-CM | POA: Diagnosis not present

## 2018-08-19 DIAGNOSIS — G40909 Epilepsy, unspecified, not intractable, without status epilepticus: Secondary | ICD-10-CM | POA: Diagnosis not present

## 2018-08-19 DIAGNOSIS — G20 Parkinson's disease: Secondary | ICD-10-CM | POA: Diagnosis not present

## 2018-08-19 DIAGNOSIS — F0280 Dementia in other diseases classified elsewhere without behavioral disturbance: Secondary | ICD-10-CM | POA: Diagnosis not present

## 2018-08-19 DIAGNOSIS — I10 Essential (primary) hypertension: Secondary | ICD-10-CM | POA: Diagnosis not present

## 2018-08-19 DIAGNOSIS — I2510 Atherosclerotic heart disease of native coronary artery without angina pectoris: Secondary | ICD-10-CM | POA: Diagnosis not present

## 2018-08-23 DIAGNOSIS — I10 Essential (primary) hypertension: Secondary | ICD-10-CM | POA: Diagnosis not present

## 2018-08-23 DIAGNOSIS — R627 Adult failure to thrive: Secondary | ICD-10-CM | POA: Diagnosis not present

## 2018-08-23 DIAGNOSIS — Z9181 History of falling: Secondary | ICD-10-CM | POA: Diagnosis not present

## 2018-08-23 DIAGNOSIS — G20 Parkinson's disease: Secondary | ICD-10-CM | POA: Diagnosis not present

## 2018-08-23 DIAGNOSIS — E785 Hyperlipidemia, unspecified: Secondary | ICD-10-CM | POA: Diagnosis not present

## 2018-08-23 DIAGNOSIS — I2510 Atherosclerotic heart disease of native coronary artery without angina pectoris: Secondary | ICD-10-CM | POA: Diagnosis not present

## 2018-08-23 DIAGNOSIS — F0280 Dementia in other diseases classified elsewhere without behavioral disturbance: Secondary | ICD-10-CM | POA: Diagnosis not present

## 2018-08-23 DIAGNOSIS — I4892 Unspecified atrial flutter: Secondary | ICD-10-CM | POA: Diagnosis not present

## 2018-08-23 DIAGNOSIS — G40909 Epilepsy, unspecified, not intractable, without status epilepticus: Secondary | ICD-10-CM | POA: Diagnosis not present

## 2018-08-29 DIAGNOSIS — G40909 Epilepsy, unspecified, not intractable, without status epilepticus: Secondary | ICD-10-CM | POA: Diagnosis not present

## 2018-08-29 DIAGNOSIS — I2510 Atherosclerotic heart disease of native coronary artery without angina pectoris: Secondary | ICD-10-CM | POA: Diagnosis not present

## 2018-08-29 DIAGNOSIS — I4892 Unspecified atrial flutter: Secondary | ICD-10-CM | POA: Diagnosis not present

## 2018-08-29 DIAGNOSIS — G20 Parkinson's disease: Secondary | ICD-10-CM | POA: Diagnosis not present

## 2018-08-29 DIAGNOSIS — F0280 Dementia in other diseases classified elsewhere without behavioral disturbance: Secondary | ICD-10-CM | POA: Diagnosis not present

## 2018-08-29 DIAGNOSIS — I10 Essential (primary) hypertension: Secondary | ICD-10-CM | POA: Diagnosis not present

## 2018-08-31 DIAGNOSIS — G20 Parkinson's disease: Secondary | ICD-10-CM | POA: Diagnosis not present

## 2018-08-31 DIAGNOSIS — I4892 Unspecified atrial flutter: Secondary | ICD-10-CM | POA: Diagnosis not present

## 2018-08-31 DIAGNOSIS — G40909 Epilepsy, unspecified, not intractable, without status epilepticus: Secondary | ICD-10-CM | POA: Diagnosis not present

## 2018-08-31 DIAGNOSIS — F0280 Dementia in other diseases classified elsewhere without behavioral disturbance: Secondary | ICD-10-CM | POA: Diagnosis not present

## 2018-08-31 DIAGNOSIS — I10 Essential (primary) hypertension: Secondary | ICD-10-CM | POA: Diagnosis not present

## 2018-08-31 DIAGNOSIS — I2510 Atherosclerotic heart disease of native coronary artery without angina pectoris: Secondary | ICD-10-CM | POA: Diagnosis not present

## 2018-09-04 DIAGNOSIS — I10 Essential (primary) hypertension: Secondary | ICD-10-CM | POA: Diagnosis not present

## 2018-09-04 DIAGNOSIS — I4892 Unspecified atrial flutter: Secondary | ICD-10-CM | POA: Diagnosis not present

## 2018-09-04 DIAGNOSIS — F0280 Dementia in other diseases classified elsewhere without behavioral disturbance: Secondary | ICD-10-CM | POA: Diagnosis not present

## 2018-09-04 DIAGNOSIS — I2510 Atherosclerotic heart disease of native coronary artery without angina pectoris: Secondary | ICD-10-CM | POA: Diagnosis not present

## 2018-09-04 DIAGNOSIS — G40909 Epilepsy, unspecified, not intractable, without status epilepticus: Secondary | ICD-10-CM | POA: Diagnosis not present

## 2018-09-04 DIAGNOSIS — G20 Parkinson's disease: Secondary | ICD-10-CM | POA: Diagnosis not present

## 2018-09-07 DIAGNOSIS — I10 Essential (primary) hypertension: Secondary | ICD-10-CM | POA: Diagnosis not present

## 2018-09-07 DIAGNOSIS — F0280 Dementia in other diseases classified elsewhere without behavioral disturbance: Secondary | ICD-10-CM | POA: Diagnosis not present

## 2018-09-07 DIAGNOSIS — G20 Parkinson's disease: Secondary | ICD-10-CM | POA: Diagnosis not present

## 2018-09-07 DIAGNOSIS — G40909 Epilepsy, unspecified, not intractable, without status epilepticus: Secondary | ICD-10-CM | POA: Diagnosis not present

## 2018-09-07 DIAGNOSIS — I4892 Unspecified atrial flutter: Secondary | ICD-10-CM | POA: Diagnosis not present

## 2018-09-07 DIAGNOSIS — I2510 Atherosclerotic heart disease of native coronary artery without angina pectoris: Secondary | ICD-10-CM | POA: Diagnosis not present

## 2018-09-11 DIAGNOSIS — G20 Parkinson's disease: Secondary | ICD-10-CM | POA: Diagnosis not present

## 2018-09-11 DIAGNOSIS — I2510 Atherosclerotic heart disease of native coronary artery without angina pectoris: Secondary | ICD-10-CM | POA: Diagnosis not present

## 2018-09-11 DIAGNOSIS — I4892 Unspecified atrial flutter: Secondary | ICD-10-CM | POA: Diagnosis not present

## 2018-09-11 DIAGNOSIS — G40909 Epilepsy, unspecified, not intractable, without status epilepticus: Secondary | ICD-10-CM | POA: Diagnosis not present

## 2018-09-11 DIAGNOSIS — F0280 Dementia in other diseases classified elsewhere without behavioral disturbance: Secondary | ICD-10-CM | POA: Diagnosis not present

## 2018-09-11 DIAGNOSIS — I10 Essential (primary) hypertension: Secondary | ICD-10-CM | POA: Diagnosis not present

## 2018-09-14 DIAGNOSIS — F0280 Dementia in other diseases classified elsewhere without behavioral disturbance: Secondary | ICD-10-CM | POA: Diagnosis not present

## 2018-09-14 DIAGNOSIS — I4892 Unspecified atrial flutter: Secondary | ICD-10-CM | POA: Diagnosis not present

## 2018-09-14 DIAGNOSIS — G20 Parkinson's disease: Secondary | ICD-10-CM | POA: Diagnosis not present

## 2018-09-14 DIAGNOSIS — I2510 Atherosclerotic heart disease of native coronary artery without angina pectoris: Secondary | ICD-10-CM | POA: Diagnosis not present

## 2018-09-14 DIAGNOSIS — G40909 Epilepsy, unspecified, not intractable, without status epilepticus: Secondary | ICD-10-CM | POA: Diagnosis not present

## 2018-09-14 DIAGNOSIS — I10 Essential (primary) hypertension: Secondary | ICD-10-CM | POA: Diagnosis not present

## 2018-09-16 DIAGNOSIS — I2510 Atherosclerotic heart disease of native coronary artery without angina pectoris: Secondary | ICD-10-CM | POA: Diagnosis not present

## 2018-09-16 DIAGNOSIS — I4892 Unspecified atrial flutter: Secondary | ICD-10-CM | POA: Diagnosis not present

## 2018-09-16 DIAGNOSIS — F0280 Dementia in other diseases classified elsewhere without behavioral disturbance: Secondary | ICD-10-CM | POA: Diagnosis not present

## 2018-09-16 DIAGNOSIS — G20 Parkinson's disease: Secondary | ICD-10-CM | POA: Diagnosis not present

## 2018-09-16 DIAGNOSIS — G40909 Epilepsy, unspecified, not intractable, without status epilepticus: Secondary | ICD-10-CM | POA: Diagnosis not present

## 2018-09-16 DIAGNOSIS — I10 Essential (primary) hypertension: Secondary | ICD-10-CM | POA: Diagnosis not present

## 2018-09-23 DIAGNOSIS — G40909 Epilepsy, unspecified, not intractable, without status epilepticus: Secondary | ICD-10-CM | POA: Diagnosis not present

## 2018-09-23 DIAGNOSIS — I10 Essential (primary) hypertension: Secondary | ICD-10-CM | POA: Diagnosis not present

## 2018-09-23 DIAGNOSIS — F0280 Dementia in other diseases classified elsewhere without behavioral disturbance: Secondary | ICD-10-CM | POA: Diagnosis not present

## 2018-09-23 DIAGNOSIS — I4892 Unspecified atrial flutter: Secondary | ICD-10-CM | POA: Diagnosis not present

## 2018-09-23 DIAGNOSIS — I2510 Atherosclerotic heart disease of native coronary artery without angina pectoris: Secondary | ICD-10-CM | POA: Diagnosis not present

## 2018-09-23 DIAGNOSIS — G20 Parkinson's disease: Secondary | ICD-10-CM | POA: Diagnosis not present

## 2018-09-28 DIAGNOSIS — G20 Parkinson's disease: Secondary | ICD-10-CM | POA: Diagnosis not present

## 2018-09-28 DIAGNOSIS — I2510 Atherosclerotic heart disease of native coronary artery without angina pectoris: Secondary | ICD-10-CM | POA: Diagnosis not present

## 2018-09-28 DIAGNOSIS — I10 Essential (primary) hypertension: Secondary | ICD-10-CM | POA: Diagnosis not present

## 2018-09-28 DIAGNOSIS — F0280 Dementia in other diseases classified elsewhere without behavioral disturbance: Secondary | ICD-10-CM | POA: Diagnosis not present

## 2018-09-28 DIAGNOSIS — I4892 Unspecified atrial flutter: Secondary | ICD-10-CM | POA: Diagnosis not present

## 2018-09-28 DIAGNOSIS — G40909 Epilepsy, unspecified, not intractable, without status epilepticus: Secondary | ICD-10-CM | POA: Diagnosis not present

## 2018-09-30 DIAGNOSIS — G20 Parkinson's disease: Secondary | ICD-10-CM | POA: Diagnosis not present

## 2018-09-30 DIAGNOSIS — I10 Essential (primary) hypertension: Secondary | ICD-10-CM | POA: Diagnosis not present

## 2018-09-30 DIAGNOSIS — I2510 Atherosclerotic heart disease of native coronary artery without angina pectoris: Secondary | ICD-10-CM | POA: Diagnosis not present

## 2018-09-30 DIAGNOSIS — G40909 Epilepsy, unspecified, not intractable, without status epilepticus: Secondary | ICD-10-CM | POA: Diagnosis not present

## 2018-09-30 DIAGNOSIS — F0280 Dementia in other diseases classified elsewhere without behavioral disturbance: Secondary | ICD-10-CM | POA: Diagnosis not present

## 2018-09-30 DIAGNOSIS — I4892 Unspecified atrial flutter: Secondary | ICD-10-CM | POA: Diagnosis not present

## 2018-10-07 DIAGNOSIS — I4892 Unspecified atrial flutter: Secondary | ICD-10-CM | POA: Diagnosis not present

## 2018-10-07 DIAGNOSIS — G20 Parkinson's disease: Secondary | ICD-10-CM | POA: Diagnosis not present

## 2018-10-07 DIAGNOSIS — I10 Essential (primary) hypertension: Secondary | ICD-10-CM | POA: Diagnosis not present

## 2018-10-07 DIAGNOSIS — G40909 Epilepsy, unspecified, not intractable, without status epilepticus: Secondary | ICD-10-CM | POA: Diagnosis not present

## 2018-10-07 DIAGNOSIS — I2510 Atherosclerotic heart disease of native coronary artery without angina pectoris: Secondary | ICD-10-CM | POA: Diagnosis not present

## 2018-10-07 DIAGNOSIS — F0280 Dementia in other diseases classified elsewhere without behavioral disturbance: Secondary | ICD-10-CM | POA: Diagnosis not present

## 2018-10-09 DIAGNOSIS — I10 Essential (primary) hypertension: Secondary | ICD-10-CM | POA: Diagnosis not present

## 2018-10-09 DIAGNOSIS — G40909 Epilepsy, unspecified, not intractable, without status epilepticus: Secondary | ICD-10-CM | POA: Diagnosis not present

## 2018-10-09 DIAGNOSIS — I2510 Atherosclerotic heart disease of native coronary artery without angina pectoris: Secondary | ICD-10-CM | POA: Diagnosis not present

## 2018-10-09 DIAGNOSIS — F0280 Dementia in other diseases classified elsewhere without behavioral disturbance: Secondary | ICD-10-CM | POA: Diagnosis not present

## 2018-10-09 DIAGNOSIS — G20 Parkinson's disease: Secondary | ICD-10-CM | POA: Diagnosis not present

## 2018-10-09 DIAGNOSIS — I4892 Unspecified atrial flutter: Secondary | ICD-10-CM | POA: Diagnosis not present

## 2018-10-12 DIAGNOSIS — F0280 Dementia in other diseases classified elsewhere without behavioral disturbance: Secondary | ICD-10-CM | POA: Diagnosis not present

## 2018-10-12 DIAGNOSIS — I4892 Unspecified atrial flutter: Secondary | ICD-10-CM | POA: Diagnosis not present

## 2018-10-12 DIAGNOSIS — G20 Parkinson's disease: Secondary | ICD-10-CM | POA: Diagnosis not present

## 2018-10-12 DIAGNOSIS — I2510 Atherosclerotic heart disease of native coronary artery without angina pectoris: Secondary | ICD-10-CM | POA: Diagnosis not present

## 2018-10-12 DIAGNOSIS — I10 Essential (primary) hypertension: Secondary | ICD-10-CM | POA: Diagnosis not present

## 2018-10-12 DIAGNOSIS — G40909 Epilepsy, unspecified, not intractable, without status epilepticus: Secondary | ICD-10-CM | POA: Diagnosis not present

## 2018-11-23 ENCOUNTER — Other Ambulatory Visit: Payer: Self-pay

## 2018-11-23 ENCOUNTER — Emergency Department (HOSPITAL_COMMUNITY): Payer: Medicare Other

## 2018-11-23 ENCOUNTER — Encounter (HOSPITAL_COMMUNITY): Payer: Self-pay | Admitting: Emergency Medicine

## 2018-11-23 ENCOUNTER — Emergency Department (HOSPITAL_COMMUNITY)
Admission: EM | Admit: 2018-11-23 | Discharge: 2018-11-23 | Disposition: A | Discharge status: Home / Self Care | Payer: Medicare Other | Attending: Emergency Medicine | Admitting: Emergency Medicine

## 2018-11-23 DIAGNOSIS — W182XXA Fall in (into) shower or empty bathtub, initial encounter: Secondary | ICD-10-CM | POA: Insufficient documentation

## 2018-11-23 DIAGNOSIS — S2231XA Fracture of one rib, right side, initial encounter for closed fracture: Secondary | ICD-10-CM | POA: Insufficient documentation

## 2018-11-23 DIAGNOSIS — Y92002 Bathroom of unspecified non-institutional (private) residence single-family (private) house as the place of occurrence of the external cause: Secondary | ICD-10-CM | POA: Diagnosis not present

## 2018-11-23 DIAGNOSIS — Z79899 Other long term (current) drug therapy: Secondary | ICD-10-CM | POA: Insufficient documentation

## 2018-11-23 DIAGNOSIS — S29091A Other injury of muscle and tendon of front wall of thorax, initial encounter: Secondary | ICD-10-CM | POA: Diagnosis present

## 2018-11-23 DIAGNOSIS — Y939 Activity, unspecified: Secondary | ICD-10-CM | POA: Insufficient documentation

## 2018-11-23 DIAGNOSIS — Y999 Unspecified external cause status: Secondary | ICD-10-CM | POA: Insufficient documentation

## 2018-11-23 MED ORDER — HYDROCODONE-ACETAMINOPHEN 5-325 MG PO TABS
1.0000 | ORAL_TABLET | ORAL | Status: AC
  Administered 2018-11-23: 1 via ORAL
  Filled 2018-11-23: qty 1

## 2018-11-23 MED ORDER — HYDROCODONE-ACETAMINOPHEN 5-325 MG PO TABS: 1.0000 | ORAL_TABLET | Freq: Four times a day (QID) | ORAL | 0 refills | Status: DC | PRN
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
## 2018-11-23 NOTE — Discharge Instructions (Addendum)
Take the medications as needed for severe pain.  You can take ibuprofen for milder pain.  The rib should heal over the next 6 to 8 weeks

## 2018-11-23 NOTE — ED Triage Notes (Signed)
Pt c/o right rib pain since falling in tub 1.5 weeks ago. Pt also c/o mid or lower back pain.

## 2019-05-28 ENCOUNTER — Other Ambulatory Visit: Payer: Self-pay

## 2019-05-28 ENCOUNTER — Emergency Department (HOSPITAL_COMMUNITY)
Admission: EM | Admit: 2019-05-28 | Discharge: 2019-05-29 | Disposition: A | Discharge status: Home / Self Care | Payer: No Typology Code available for payment source | Attending: Emergency Medicine | Admitting: Emergency Medicine

## 2019-05-28 ENCOUNTER — Emergency Department (HOSPITAL_COMMUNITY): Payer: No Typology Code available for payment source

## 2019-05-28 ENCOUNTER — Encounter (HOSPITAL_COMMUNITY): Payer: Self-pay | Admitting: Emergency Medicine

## 2019-05-28 DIAGNOSIS — R630 Anorexia: Secondary | ICD-10-CM | POA: Insufficient documentation

## 2019-05-28 DIAGNOSIS — R0602 Shortness of breath: Secondary | ICD-10-CM | POA: Diagnosis not present

## 2019-05-28 DIAGNOSIS — Z8679 Personal history of other diseases of the circulatory system: Secondary | ICD-10-CM | POA: Diagnosis not present

## 2019-05-28 DIAGNOSIS — R7989 Other specified abnormal findings of blood chemistry: Secondary | ICD-10-CM | POA: Diagnosis not present

## 2019-05-28 DIAGNOSIS — Z87891 Personal history of nicotine dependence: Secondary | ICD-10-CM | POA: Diagnosis not present

## 2019-05-28 DIAGNOSIS — R072 Precordial pain: Secondary | ICD-10-CM | POA: Diagnosis not present

## 2019-05-28 DIAGNOSIS — R5383 Other fatigue: Secondary | ICD-10-CM | POA: Diagnosis not present

## 2019-05-28 DIAGNOSIS — R110 Nausea: Secondary | ICD-10-CM | POA: Insufficient documentation

## 2019-05-28 DIAGNOSIS — G20 Parkinson's disease: Secondary | ICD-10-CM | POA: Diagnosis not present

## 2019-05-28 DIAGNOSIS — R079 Chest pain, unspecified: Secondary | ICD-10-CM | POA: Diagnosis not present

## 2019-05-28 DIAGNOSIS — R071 Chest pain on breathing: Secondary | ICD-10-CM | POA: Insufficient documentation

## 2019-05-28 DIAGNOSIS — R251 Tremor, unspecified: Secondary | ICD-10-CM | POA: Insufficient documentation

## 2019-05-28 DIAGNOSIS — Z79899 Other long term (current) drug therapy: Secondary | ICD-10-CM | POA: Insufficient documentation

## 2019-05-28 LAB — CBC
HCT: 37.6 % — ABNORMAL LOW (ref 39.0–52.0)
Hemoglobin: 12.5 g/dL — ABNORMAL LOW (ref 13.0–17.0)
MCH: 32.6 pg (ref 26.0–34.0)
MCHC: 33.2 g/dL (ref 30.0–36.0)
MCV: 98.2 fL (ref 80.0–100.0)
Platelets: 162 10*3/uL (ref 150–400)
RBC: 3.83 MIL/uL — ABNORMAL LOW (ref 4.22–5.81)
RDW: 12.7 % (ref 11.5–15.5)
WBC: 4.3 10*3/uL (ref 4.0–10.5)
nRBC: 0 % (ref 0.0–0.2)

## 2019-05-28 LAB — BASIC METABOLIC PANEL
Anion gap: 11 (ref 5–15)
BUN: 15 mg/dL (ref 8–23)
CO2: 29 mmol/L (ref 22–32)
Calcium: 9.2 mg/dL (ref 8.9–10.3)
Chloride: 99 mmol/L (ref 98–111)
Creatinine, Ser: 0.83 mg/dL (ref 0.61–1.24)
GFR calc Af Amer: >60 mL/min (ref >60)
GFR calc non Af Amer: >60 mL/min (ref >60)
Glucose, Bld: 99 mg/dL (ref 70–99)
Potassium: 4.3 mmol/L (ref 3.5–5.1)
Sodium: 139 mmol/L (ref 135–145)

## 2019-05-28 LAB — D-DIMER, QUANTITATIVE (NOT AT ARMC): D-Dimer, Quant: 1.43 ug/mL-FEU — ABNORMAL HIGH (ref 0.00–0.50)

## 2019-05-28 LAB — TROPONIN I
Troponin I: <0.03 ng/mL (ref <0.03)
Troponin I: <0.03 ng/mL (ref <0.03)

## 2019-05-28 LAB — VALPROIC ACID LEVEL: Valproic Acid Lvl: 80 ug/mL (ref 50.0–100.0)

## 2019-05-28 LAB — CARBAMAZEPINE LEVEL, TOTAL: Carbamazepine Lvl: 4 ug/mL (ref 4.0–12.0)

## 2019-05-28 MED ORDER — SODIUM CHLORIDE 0.9% FLUSH: 3.0000 mL | Freq: Once | INTRAVENOUS | Status: DC

## 2019-05-28 MED ORDER — SODIUM CHLORIDE 0.9 % IV BOLUS
500.0000 mL | Freq: Once | INTRAVENOUS | Status: AC
  Administered 2019-05-28: 500 mL via INTRAVENOUS

## 2019-05-28 MED ORDER — IOHEXOL 350 MG/ML SOLN
100.0000 mL | Freq: Once | INTRAVENOUS | Status: AC | PRN
  Administered 2019-05-28: 100 mL via INTRAVENOUS

## 2019-05-28 MED ORDER — MORPHINE SULFATE (PF) 4 MG/ML IV SOLN
4.0000 mg | Freq: Once | INTRAVENOUS | Status: AC
  Administered 2019-05-28: 4 mg via INTRAVENOUS
  Filled 2019-05-28: qty 1

## 2019-05-28 MED ORDER — ONDANSETRON HCL 4 MG/2ML IJ SOLN
4.0000 mg | Freq: Once | INTRAMUSCULAR | Status: AC
  Administered 2019-05-28: 4 mg via INTRAVENOUS
  Filled 2019-05-28: qty 2

## 2019-05-28 NOTE — ED Triage Notes (Signed)
Chest pain x 1 week, decreased appetite, per family patient has not eaten in 4 days. Confusion is worse, sleeping all day.  Only drinks to take his medications. Patient has VNS - vagel nerve stimulator

## 2019-05-28 NOTE — ED Provider Notes (Signed)
[facility] EMERGENCY DEPARTMENT Provider Note   CSN: [id number] Arrival date & time: 05/28/19  1653    History   Chief Complaint Chief Complaint  Patient presents with   Chest Pain    HPI COLSEN MODI is a 63 y.o. male with a history of atrial fibrillation treated by ablation, epilepsy and early Parkinson's presenting with his dg at the bedside with complaint of generalized fatigue,nausea without emesis,  increased drowsiness (daughter stating he has slept for the majority of the past 4 days)  and poor PO intake along with midsternal chest pain, sharp and pleuritic in character, worsened with deep inspiration.   He states the last time he was drowsiness like this his "vitamin levels were low and he required admission at the [facility] hospital.  He is unsure of specific vitamin abnormalities, but it is noted that he is currently taking folic acid, vitamin V61 and vitamin D supplements.  His chest pain is worsened with deep inspiration, resolved at rest.  There is no radiation of his pain which he describes as sharp.  He does endorse shortness of breath with exertion, but not at rest.  He denies fevers or chills, no dysuria, abdominal pain or diarrhea.     The history is provided by the patient and a relative.  Chest Pain Associated symptoms: fatigue, nausea and shortness of breath   Associated symptoms: no abdominal pain, no dizziness, no fever, no headache, no numbness, no palpitations, no vomiting and no weakness     Past Medical History:  Diagnosis Date   A-fib ([place])    Hypercholesteremia    Seizures ([place])     There are no active problems to display for this patient.   Past Surgical History:  Procedure Laterality Date   APPENDECTOMY     CARDIAC ELECTROPHYSIOLOGY STUDY AND ABLATION     IMPLANTATION VAGAL NERVE STIMULATOR  03/2017        Home Medications    Prior to Admission medications   Medication Sig Start Date End Date Taking? Authorizing Provider   carbamazepine (TEGRETOL) 200 MG tablet Take 200 mg by mouth 3 (three) times daily.    Yes [provider]  carbidopa-levodopa (SINEMET IR) 25-100 MG tablet Take 1 tablet by mouth 3 (three) times daily.   Yes [provider]  divalproex (DEPAKOTE ER) 500 MG 24 hr tablet Take 1,000 mg by mouth 2 (two) times daily.   Yes [provider]  folic acid (FOLVITE) 1 MG tablet Take 1 mg by mouth daily.   Yes [provider]  ibuprofen (ADVIL,MOTRIN) 800 MG tablet Take 800 mg by mouth every 8 (eight) hours as needed.   Yes [provider]  primidone (MYSOLINE) 250 MG tablet Take 250 mg by mouth 2 (two) times daily.   Yes [provider]  sildenafil (VIAGRA) 100 MG tablet Take 100 mg by mouth daily as needed for erectile dysfunction.   Yes [provider]  simvastatin (ZOCOR) 40 MG tablet Take 20 mg by mouth at bedtime.    Yes [provider]  vitamin B-12 (CYANOCOBALAMIN) 1000 MCG tablet Take 1,000 mcg by mouth daily.   Yes [provider]  Vitamin D, Ergocalciferol, (DRISDOL) 50000 UNITS CAPS capsule Take 50,000 Units by mouth every Thursday.   Yes [provider]    Family History Family History  Problem Relation Age of Onset   Heart disease Mother        pacemaker in his 39s   Heart failure  Father    Stroke Father    Heart attack Brother        2840s    Social History Social History   Tobacco Use   Smoking status: Never Smoker   Smokeless tobacco: Never Used  Substance Use Topics   Alcohol use: No   Drug use: No     Allergies   Patient has no known allergies.   Review of Systems Review of Systems  Constitutional: Positive for activity change, appetite change and fatigue. Negative for fever.  HENT: Positive for sore throat. Negative for congestion.   Eyes: Negative.   Respiratory: Positive for shortness of breath. Negative for chest tightness.   Cardiovascular: Positive for chest  pain. Negative for palpitations and leg swelling.  Gastrointestinal: Positive for nausea. Negative for abdominal pain and vomiting.  Genitourinary: Negative.   Musculoskeletal: Negative for arthralgias, joint swelling and neck pain.  Skin: Negative.  Negative for rash and wound.  Neurological: Negative for dizziness, weakness, light-headedness, numbness and headaches.  Psychiatric/Behavioral: Negative.      Physical Exam Updated Vital Signs BP (!) 145/85    Pulse 96    Temp 98.5 F (36.9 C) (Oral)    Resp 20    Ht 5\' 11"  (1.803 m)    Wt 81.6 kg    SpO2 99%    BMI 25.10 kg/m   Physical Exam Vitals signs and nursing note reviewed.  Constitutional:      Appearance: He is well-developed.  HENT:     Head: Normocephalic and atraumatic.     Mouth/Throat:     Mouth: Mucous membranes are moist.     Pharynx: Oropharynx is clear. No pharyngeal swelling, oropharyngeal exudate or posterior oropharyngeal erythema.  Eyes:     Conjunctiva/sclera: Conjunctivae normal.  Neck:     Musculoskeletal: Normal range of motion.  Cardiovascular:     Rate and Rhythm: Regular rhythm. Bradycardia present.     Heart sounds: Normal heart sounds.  Pulmonary:     Effort: Pulmonary effort is normal.     Breath sounds: Normal breath sounds. No wheezing, rhonchi or rales.  Chest:     Chest wall: No tenderness or crepitus.     Comments: No reproducible chest wall pain. Abdominal:     General: Bowel sounds are normal.     Palpations: Abdomen is soft.     Tenderness: There is no abdominal tenderness.  Musculoskeletal: Normal range of motion.  Skin:    General: Skin is warm and dry.  Neurological:     Mental Status: He is alert and oriented to person, place, and time.     Cranial Nerves: Cranial nerves are intact. No cranial nerve deficit.     Sensory: Sensation is intact.     Motor: Tremor present.     Comments: Mild intentional tremor noted in upper extremities. Negative pronator drift.  Equal grip  strength.  No foot drop. Moving all 4 extremities without deficit.      ED Treatments / Results  Labs (all labs ordered are listed, but only abnormal results are displayed) Labs Reviewed  CBC - Abnormal; Notable for the following components:      Result Value   RBC 3.83 (*)    Hemoglobin 12.5 (*)    HCT 37.6 (*)    All other components within normal limits  D-DIMER, QUANTITATIVE (NOT AT [facility]) - Abnormal; Notable for the following components:   D-Dimer, Quant 1.43 (*)    All other components within normal limits  BASIC METABOLIC PANEL  TROPONIN I  CARBAMAZEPINE LEVEL, TOTAL  VALPROIC ACID LEVEL  TROPONIN I  URINALYSIS, ROUTINE W REFLEX MICROSCOPIC  TSH    EKG EKG Interpretation  Date/Time:  Friday May 28 2019 20:11:18 EDT Ventricular Rate:  54 PR Interval:    QRS Duration: 88 QT Interval:  414 QTC Calculation: 393 R Axis:   12 Text Interpretation:  Sinus rhythm Abnormal R-wave progression, early transition No significant change since last tracing Confirmed by Vanetta MuldersZackowski, Scott [phone]([id number]) on 05/28/2019 8:30:21 PM   Radiology Dg Chest 2 View  Result Date: 05/28/2019 CLINICAL DATA:  Initial evaluation for acute chest pain. EXAM: CHEST - 2 VIEW COMPARISON:  Prior radiograph from 11/23/2018. FINDINGS: Generator for vagal nerve stimulator overlies the left chest. Cardiac and mediastinal silhouette within normal limits. Lungs well inflated. No focal infiltrates, pulmonary edema, or pleural effusion. No pneumothorax. Mild wedging of the T12 vertebral body, new relative to 2019, but favored to be chronic. No acute osseous abnormality. IMPRESSION: 1. No active cardiopulmonary disease. 2. Mild anterior wedging of the T12 vertebral body, new relative to 06/09/2018, but favored to be chronic in nature. Correlation with history and physical exam recommended. Electronically Signed   By: Rise MuBenjamin  McClintock M.D.   On: 05/28/2019 17:37   Ct Angio Chest Pe W And/or Wo Contrast  Result Date:  05/28/2019 CLINICAL DATA:  Chest pain for 1 week.  Elevated D-dimer. EXAM: CT ANGIOGRAPHY CHEST WITH CONTRAST TECHNIQUE: Multidetector CT imaging of the chest was performed using the standard protocol during bolus administration of intravenous contrast. Multiplanar CT image reconstructions and MIPs were obtained to evaluate the vascular anatomy. CONTRAST:  100 mL OMNIPAQUE IOHEXOL 350 MG/ML SOLN COMPARISON:  PA and lateral chest today. FINDINGS: Cardiovascular: Satisfactory opacification of the pulmonary arteries to the segmental level. No evidence of pulmonary embolism. Normal heart size. No pericardial effusion. Calcific coronary artery calcifications noted. Mediastinum/Nodes: No enlarged mediastinal, hilar, or axillary lymph nodes. Thyroid gland, trachea, and esophagus demonstrate no significant findings. Lungs/Pleura: Lungs are clear. No pleural effusion or pneumothorax. Upper Abdomen: Negative. Musculoskeletal: No acute or focal abnormality. Review of the MIP images confirms the above findings. IMPRESSION: Negative for pulmonary embolus.  No acute disease. Calcific coronary atherosclerosis. Electronically Signed   By: Drusilla Kannerhomas  Dalessio M.D.   On: 05/28/2019 23:07    Procedures Procedures (including critical care time)  Medications Ordered in ED Medications  sodium chloride flush (NS) 0.9 % injection 3 mL (has no administration in time range)  sodium chloride 0.9 % bolus 500 mL (0 mLs Intravenous Stopped 05/28/19 2245)  iohexol (OMNIPAQUE) 350 MG/ML injection 100 mL (100 mLs Intravenous Contrast Given 05/28/19 2247)  ondansetron (ZOFRAN) injection 4 mg (4 mg Intravenous Given 05/28/19 2206)  morphine 4 MG/ML injection 4 mg (4 mg Intravenous Given 05/28/19 2206)     Initial Impression / Assessment and Plan / ED Course  I have reviewed the triage vital signs and the nursing notes.  Pertinent labs & imaging results that were available during my care of the patient were reviewed by me and considered in  my medical decision making (see chart for details).        Imaging reviewed, pt has no c/o thoracic back pain or injury.  Currently pending UA result.  Pt seen by Dr. Hyacinth MeekerMiller who assumes care.  TSH added to assess for thyroid source of fatigue.   Final Clinical Impressions(s) / ED Diagnoses   Final diagnoses:  Chest pain on breathing  Fatigue, unspecified type  ED Discharge Orders    None       Victoriano Laindol, Mattthew Ziomek, PA-C 05/29/19 0018    Eber HongMiller, Brian, MD 05/29/19 [phone]

## 2019-05-29 LAB — URINALYSIS, ROUTINE W REFLEX MICROSCOPIC
Glucose, UA: NEGATIVE mg/dL
Hgb urine dipstick: NEGATIVE
Ketones, ur: 20 mg/dL — AB
Leukocytes,Ua: NEGATIVE
Nitrite: NEGATIVE
Protein, ur: NEGATIVE mg/dL
Specific Gravity, Urine: >1.046 — ABNORMAL HIGH (ref 1.005–1.030)
pH: 6 (ref 5.0–8.0)

## 2019-05-29 LAB — TSH: TSH: 3.077 u[IU]/mL (ref 0.350–4.500)

## 2019-05-29 MED ORDER — SODIUM CHLORIDE 0.9 % IV BOLUS
500.0000 mL | Freq: Once | INTRAVENOUS | Status: AC
  Administered 2019-05-29: 500 mL via INTRAVENOUS

## 2019-05-29 NOTE — Discharge Instructions (Signed)
Please follow-up with the [facility] hospital or your doctor at the [facility] system, your testing tonight was reassuring and showed no signs of blood clot, pneumonia, heart attack or any other significant problems.  This may be a pain in your chest with regards to the muscles or bones of your chest wall.  Your thyroid function is normal, your urine showed no infection however it did confirm that you were dehydrated which is why we gave you IV fluids.  Over the next 48 hours please rest and drink plenty of fluids, call your doctor in the morning for close follow-up appointment within the next week but return to the emergency department for worsening symptoms

## 2019-05-29 NOTE — ED Notes (Signed)
Pt ambulated about 200 ft, steady gait with Crecencio Kwiatek, no complaints

## 2019-07-15 ENCOUNTER — Other Ambulatory Visit: Payer: Self-pay

## 2019-07-15 ENCOUNTER — Emergency Department (HOSPITAL_COMMUNITY): Payer: No Typology Code available for payment source

## 2019-07-15 ENCOUNTER — Encounter (HOSPITAL_COMMUNITY): Payer: Self-pay | Admitting: Emergency Medicine

## 2019-07-15 ENCOUNTER — Emergency Department (HOSPITAL_COMMUNITY)
Admission: EM | Admit: 2019-07-15 | Discharge: 2019-07-15 | Disposition: A | Discharge status: Home / Self Care | Payer: No Typology Code available for payment source | Attending: Emergency Medicine | Admitting: Emergency Medicine

## 2019-07-15 DIAGNOSIS — S39012A Strain of muscle, fascia and tendon of lower back, initial encounter: Secondary | ICD-10-CM | POA: Diagnosis not present

## 2019-07-15 DIAGNOSIS — Y929 Unspecified place or not applicable: Secondary | ICD-10-CM | POA: Insufficient documentation

## 2019-07-15 DIAGNOSIS — S29012A Strain of muscle and tendon of back wall of thorax, initial encounter: Secondary | ICD-10-CM | POA: Diagnosis not present

## 2019-07-15 DIAGNOSIS — W01190A Fall on same level from slipping, tripping and stumbling with subsequent striking against furniture, initial encounter: Secondary | ICD-10-CM | POA: Insufficient documentation

## 2019-07-15 DIAGNOSIS — Y999 Unspecified external cause status: Secondary | ICD-10-CM | POA: Diagnosis not present

## 2019-07-15 DIAGNOSIS — S20212A Contusion of left front wall of thorax, initial encounter: Secondary | ICD-10-CM | POA: Diagnosis not present

## 2019-07-15 DIAGNOSIS — Z79899 Other long term (current) drug therapy: Secondary | ICD-10-CM | POA: Diagnosis not present

## 2019-07-15 DIAGNOSIS — S51012A Laceration without foreign body of left elbow, initial encounter: Secondary | ICD-10-CM | POA: Insufficient documentation

## 2019-07-15 DIAGNOSIS — S299XXA Unspecified injury of thorax, initial encounter: Secondary | ICD-10-CM | POA: Diagnosis present

## 2019-07-15 DIAGNOSIS — Y9389 Activity, other specified: Secondary | ICD-10-CM | POA: Insufficient documentation

## 2019-07-15 MED ORDER — IBUPROFEN 400 MG PO TABS: 400.0000 mg | ORAL_TABLET | Freq: Four times a day (QID) | ORAL | 0 refills | Status: DC | PRN

## 2019-07-15 MED ORDER — IBUPROFEN 400 MG PO TABS
400.0000 mg | ORAL_TABLET | Freq: Once | ORAL | Status: AC
  Administered 2019-07-15: 09:00:00 400 mg via ORAL
  Filled 2019-07-15: qty 1

## 2019-07-15 MED ORDER — MORPHINE SULFATE (PF) 4 MG/ML IV SOLN
6.0000 mg | Freq: Once | INTRAVENOUS | Status: AC
  Administered 2019-07-15: 6 mg via INTRAMUSCULAR
  Filled 2019-07-15: qty 2

## 2019-07-15 MED ORDER — ACETAMINOPHEN 500 MG PO TABS: 500.0000 mg | ORAL_TABLET | Freq: Four times a day (QID) | ORAL | 0 refills | Status: DC | PRN

## 2019-07-15 NOTE — ED Provider Notes (Signed)
[facility] EMERGENCY DEPARTMENT Provider Note   CSN: [id number] Arrival date & time: 07/15/19  [id number]     History   Chief Complaint No chief complaint on file.   HPI Eric [name] is a 63 y.o. male.     HPI  63yM with L chest and mid back pain. Larey SeatFell this morning when getting up to use the bathroom. Lost balance and fell over recliner. Severe L chest pain since. Worse with movement/breathing. Also pain in mid back. He isn't sure if hit head. No LOC. Denies HA or neck pain. Listed hx of afib but is not anticoagulated. No acute neurological complaints. At baseline per sister at bedside.   Past Medical History:  Diagnosis Date  . A-fib ([facility])   . Hypercholesteremia   . Seizures ([facility])     There are no active problems to display for this patient.   Past Surgical History:  Procedure Laterality Date  . APPENDECTOMY    . CARDIAC ELECTROPHYSIOLOGY STUDY AND ABLATION    . IMPLANTATION VAGAL NERVE STIMULATOR  03/2017       Home Medications    Prior to Admission medications   Medication Sig Start Date End Date Taking? Authorizing Provider  carbamazepine (TEGRETOL) 200 MG tablet Take 200 mg by mouth 3 (three) times daily.     [provider]  carbidopa-levodopa (SINEMET IR) 25-100 MG tablet Take 1 tablet by mouth 3 (three) times daily.    [provider]  divalproex (DEPAKOTE ER) 500 MG 24 hr tablet Take 1,000 mg by mouth 2 (two) times daily.    [provider]  folic acid (FOLVITE) 1 MG tablet Take 1 mg by mouth daily.    [provider]  ibuprofen (ADVIL,MOTRIN) 800 MG tablet Take 800 mg by mouth every 8 (eight) hours as needed.    [provider]  primidone (MYSOLINE) 250 MG tablet Take 250 mg by mouth 2 (two) times daily.    [provider]  sildenafil (VIAGRA) 100 MG tablet Take 100 mg by mouth daily as needed for erectile dysfunction.    [provider]  simvastatin (ZOCOR) 40 MG tablet Take 20 mg by mouth at  bedtime.     [provider]  vitamin B-12 (CYANOCOBALAMIN) 1000 MCG tablet Take 1,000 mcg by mouth daily.    [provider]  Vitamin D, Ergocalciferol, (DRISDOL) 50000 UNITS CAPS capsule Take 50,000 Units by mouth every Thursday.    [provider]    Family History Family History  Problem Relation Age of Onset  . Heart disease Mother        pacemaker in his 5680s  . Heart failure Father   . Stroke Father   . Heart attack Brother        5340s    Social History Social History   Tobacco Use  . Smoking status: Never Smoker  . Smokeless tobacco: Never Used  Substance Use Topics  . Alcohol use: No  . Drug use: No     Allergies   Patient has no known allergies.   Review of Systems Review of Systems  All systems reviewed and negative, other than as noted in HPI.  Physical Exam Updated Vital Signs There were no vitals taken for this visit.  Physical Exam Vitals signs and nursing note reviewed.  Constitutional:      General: He is not in acute distress.    Appearance: He is well-developed.  HENT:     Head: Normocephalic and atraumatic.  Eyes:     General:        Right eye: No discharge.        Left eye: No discharge.     Conjunctiva/sclera: Conjunctivae normal.  Neck:     Musculoskeletal: Neck supple.  Cardiovascular:     Rate and Rhythm: Normal rate and regular rhythm.     Heart sounds: Normal heart sounds. No murmur. No friction rub. No gallop.   Pulmonary:     Effort: Pulmonary effort is normal. No respiratory distress.     Breath sounds: Normal breath sounds.     Comments: TTP L chest wall and mid thoracic back. No overlying skin changes. No crepitus. Breath sounds symmetric.  Chest:     Chest wall: Tenderness present.  Abdominal:     General: There is no distension.     Palpations: Abdomen is soft.     Tenderness: There is no abdominal tenderness.  Musculoskeletal:     Comments: Skin tear near L elbow. No bony tenderness of  extremities or apparent pain with ROM of large joints.   Skin:    General: Skin is warm and dry.  Neurological:     Mental Status: He is alert.  Psychiatric:        Behavior: Behavior normal.        Thought Content: Thought content normal.      ED Treatments / Results  Labs (all labs ordered are listed, but only abnormal results are displayed) Labs Reviewed - No data to display  EKG EKG Interpretation  Date/Time:  Thursday July 15 2019 07:27:54 EDT Ventricular Rate:  43 PR Interval:    QRS Duration: 85 QT Interval:  439 QTC Calculation: 372 R Axis:   22 Text Interpretation:  Sinus bradycardia Abnormal R-wave progression, early transition Confirmed by Raeford RazorKohut, Shakiya Mcneary [phone]([id number]) on 07/15/2019 7:30:28 AM   Radiology Dg Ribs Unilateral W/chest Left  Result Date: 07/15/2019 CLINICAL DATA:  Status post fall this morning. Left chest pain. Initial encounter. EXAM: LEFT RIBS AND CHEST - 3+ VIEW COMPARISON:  PA and lateral chest 05/28/2019.  CT chest 05/28/2019. FINDINGS: The lungs are clear. Stimulator device is again seen over the left chest. No pneumothorax or pleural fluid. Heart size is normal. No fracture is identified. IMPRESSION: Negative for fracture.  No acute disease. Electronically Signed   By: Drusilla Kannerhomas  Dalessio M.D.   On: 07/15/2019 08:46   Dg Thoracic Spine 2 View  Result Date: 07/15/2019 CLINICAL DATA:  Fall.  Left chest pain EXAM: THORACIC SPINE 2 VIEWS COMPARISON:  CT chest 05/28/2019 FINDINGS: Mild fracture T12 appears chronic and unchanged with associated anterior spurring at T11-12. No acute thoracic fracture.  Remaining disc spaces intact. Vagal stimulator noted in the left neck. IMPRESSION: Negative for acute fracture.  Chronic fracture T12. Electronically Signed   By: Marlan Palauharles  Clark M.D.   On: 07/15/2019 08:42    Procedures Procedures (including critical care time)  Medications Ordered in ED Medications - No data to display   Initial Impression / Assessment and  Plan / ED Course  I have reviewed the triage vital signs and the nursing notes.  Pertinent labs & imaging results that were available during my care of the patient were reviewed by me and considered in my medical decision making (see chart for details).  Clinical Course as of Jul 15 839  Thu Jul 15, 2019  [id number] [facility] [WF]    Clinical Course User Index [WF] Gerome ApleyFaulkner, William J, Student-PA  63yM with L chest and back pain after mechanical fall. Imaging ok. No neuro complaints. Abdomen benign. No HA or neck pain. Not anticoagulated.   Final Clinical Impressions(s) / ED Diagnoses   Final diagnoses:  Contusion of left chest wall, initial encounter  Back strain, initial encounter    ED Discharge Orders    None       Virgel Manifold, MD 07/15/19 [phone]

## 2019-07-15 NOTE — ED Triage Notes (Signed)
Pt fell over his recliner this morning, injuring his left foot and left rib cage.  Pain with movement. States hitting his head and denies being on blood thinners

## 2019-09-20 IMAGING — DX LEFT RIBS AND CHEST - 3+ VIEW
5 series · 5 of 5 positions shown · non-contrast
Comparison: PA and lateral chest 05/28/2019.  CT chest 05/28/2019.

CLINICAL DATA: Status post fall this morning. Left chest pain.
Initial encounter.

EXAM:
LEFT RIBS AND CHEST - 3+ VIEW

[chest pa]
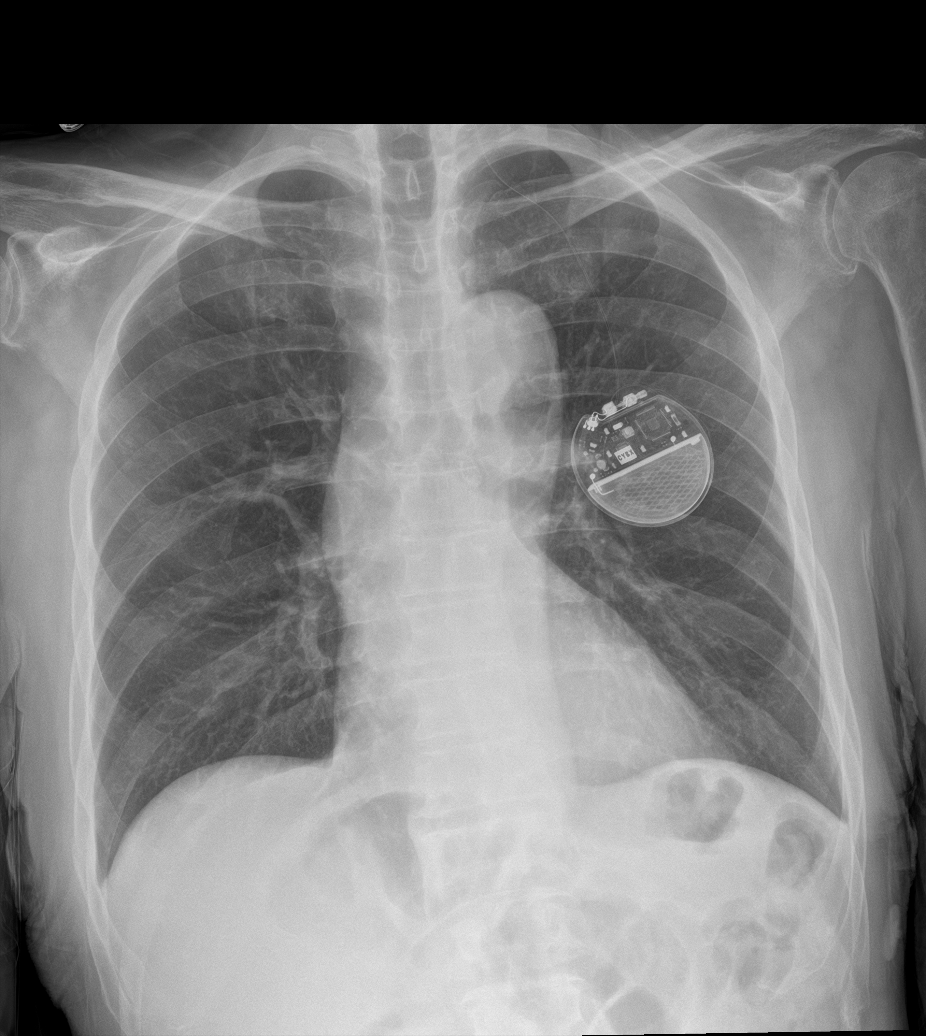

[rib pa obl (1 of 3)]
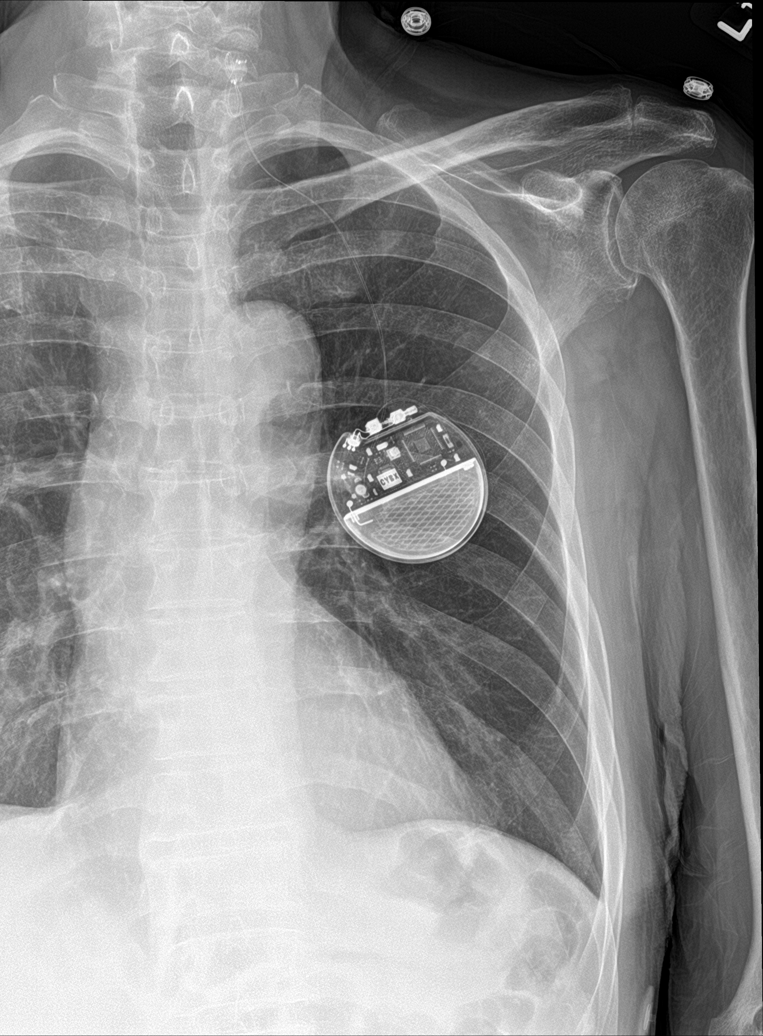

[rib pa obl (2 of 3)]
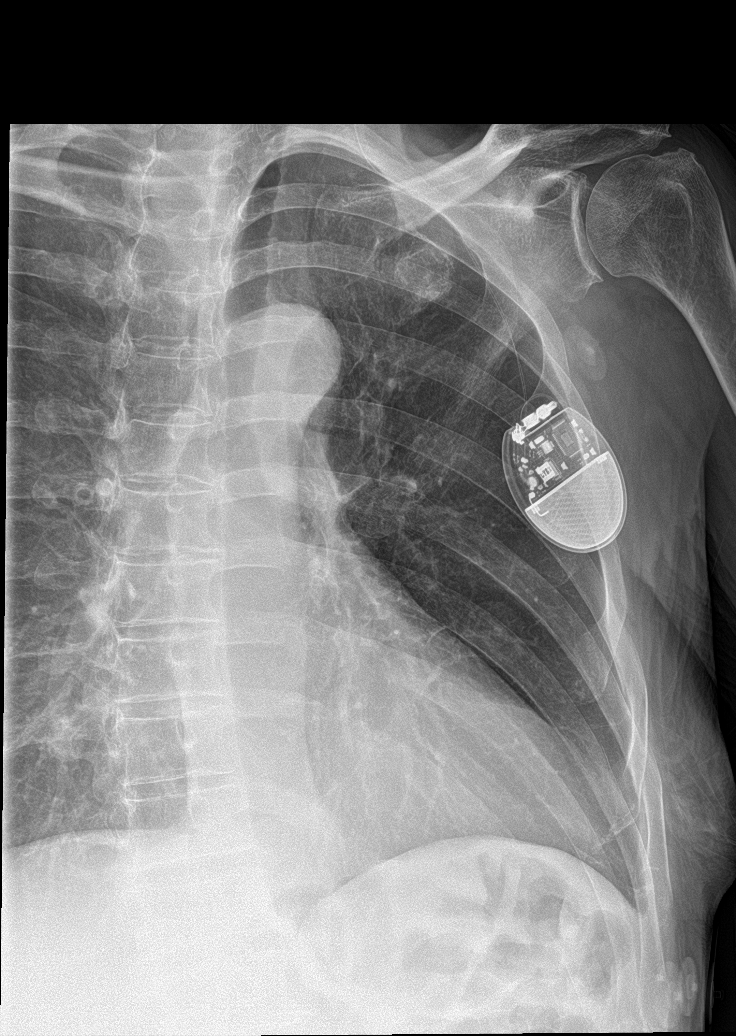

[rib pa]
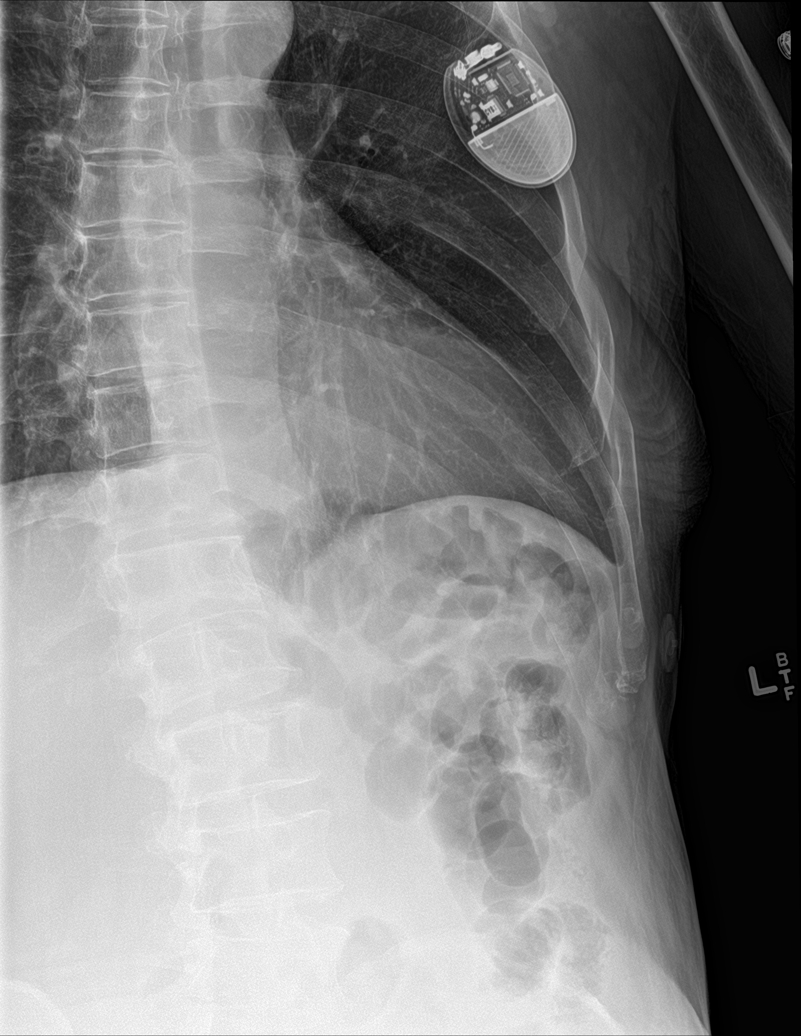

[rib pa obl (3 of 3)]
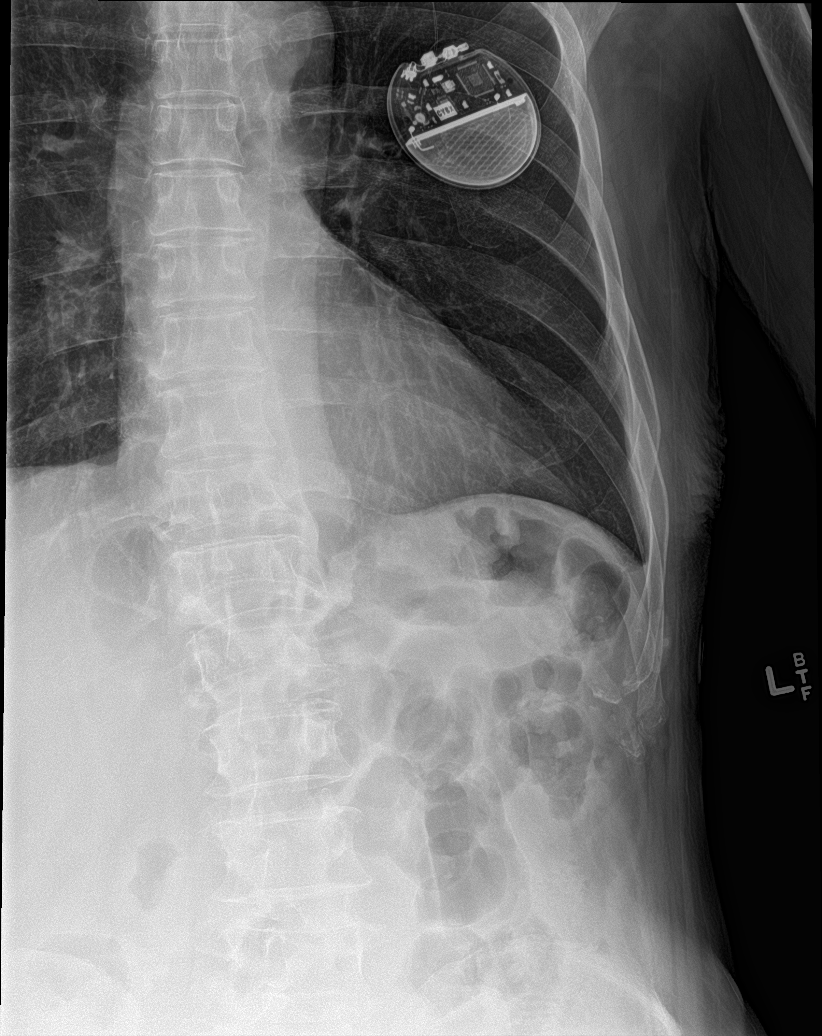

[5 of 5 positions shown; findings below may reference images not displayed]

FINDINGS: The lungs are clear. Stimulator device is again seen over the left
chest. No pneumothorax or pleural fluid. Heart size is normal. No
fracture is identified.
IMPRESSION: Negative for fracture.  No acute disease.

## 2019-09-20 IMAGING — DX THORACIC SPINE 2 VIEWS
3 series · 3 of 3 positions shown · non-contrast
Comparison: CT chest 05/28/2019

CLINICAL DATA: Fall.  Left chest pain

EXAM:
THORACIC SPINE 2 VIEWS

[t-spine ap]
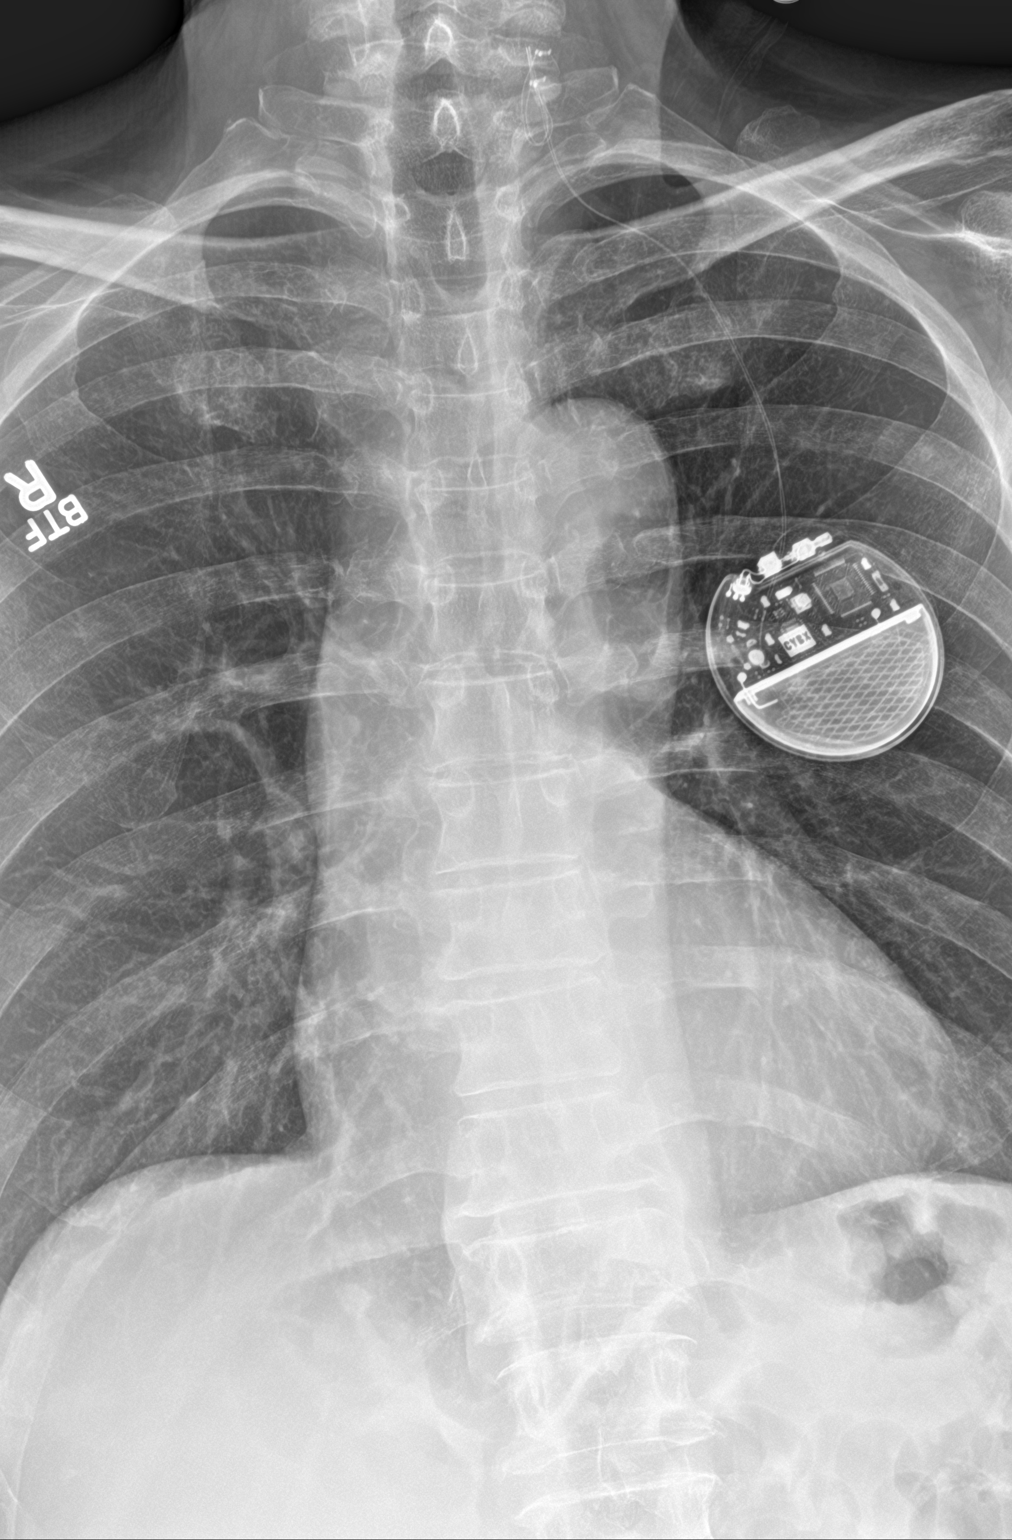

[t-spine lat]
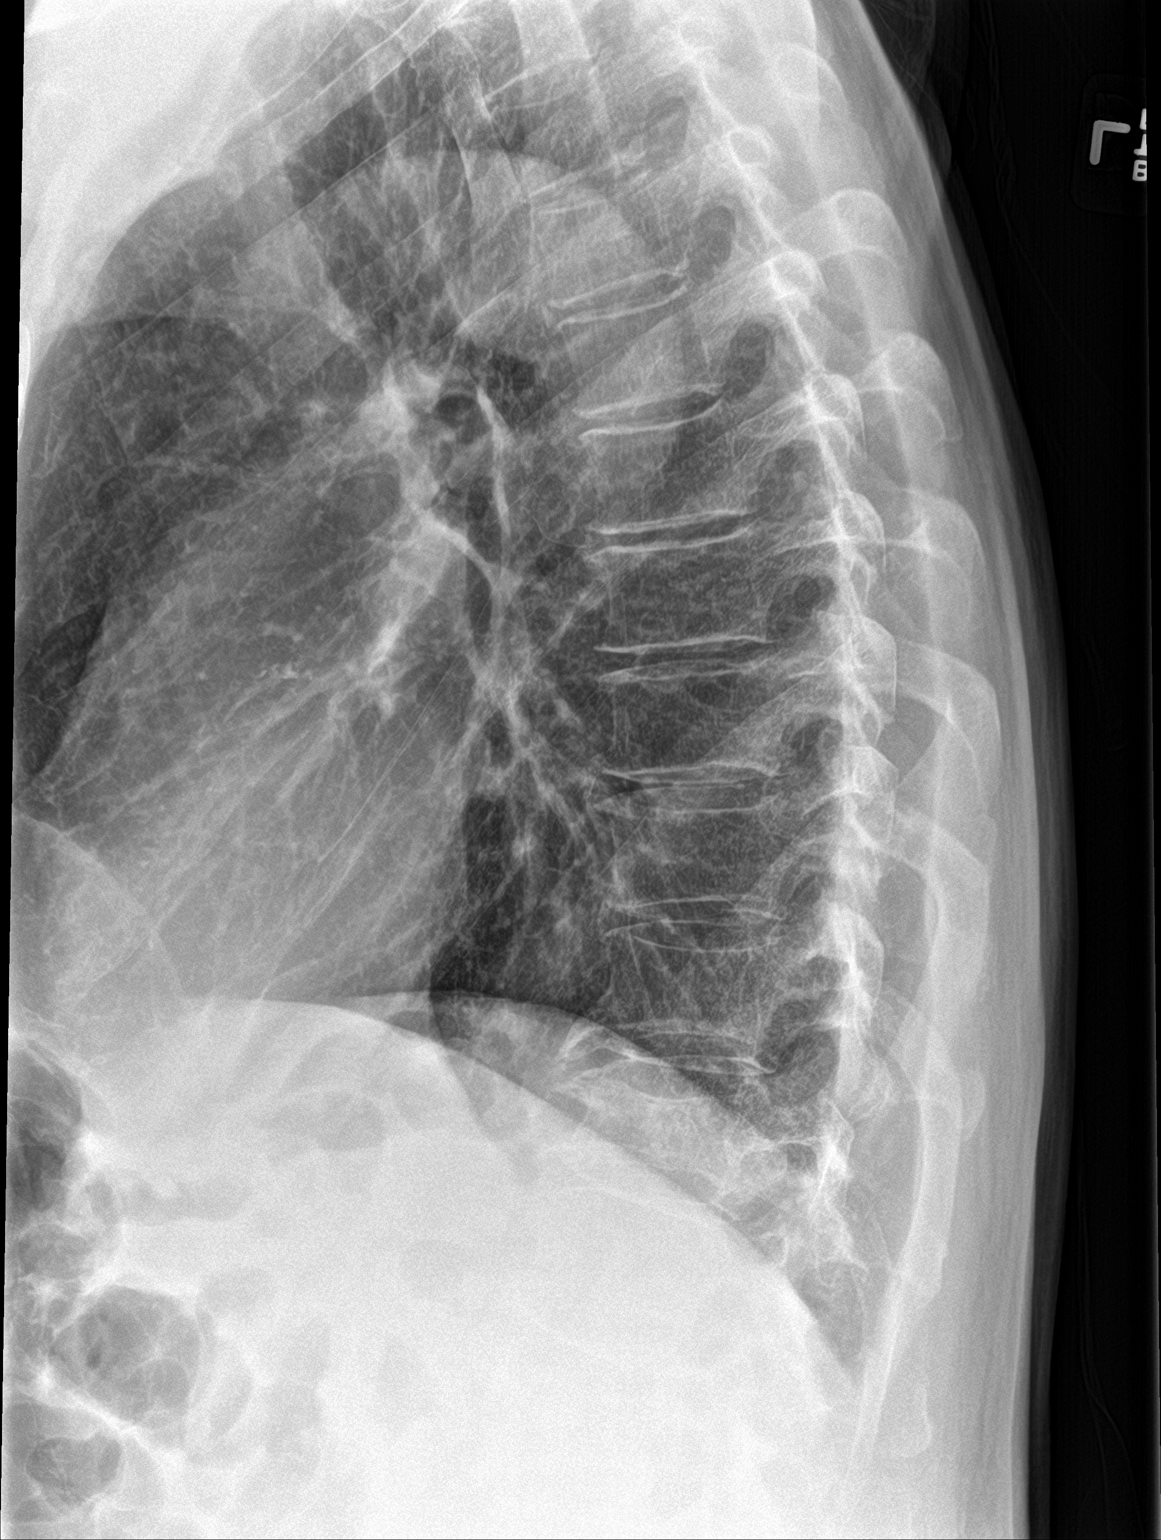

[t-spine swimmers]
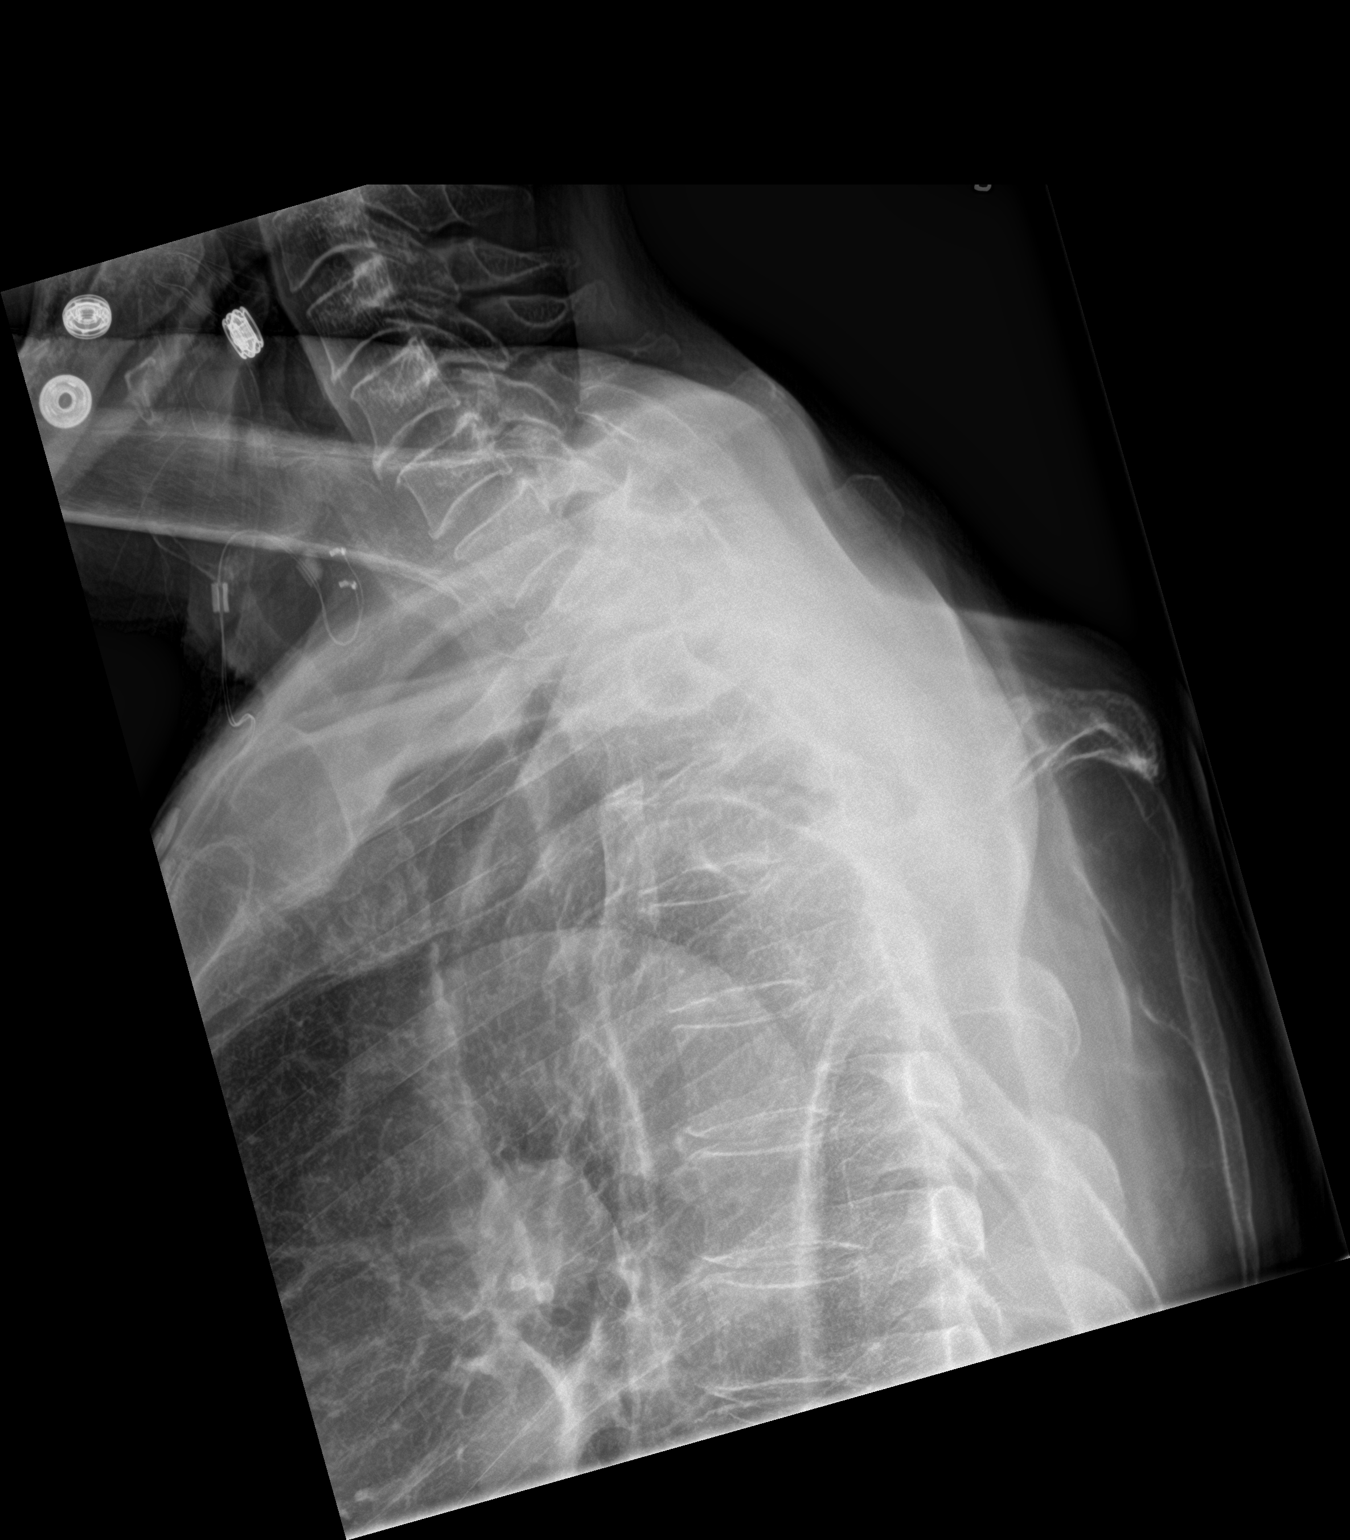

[3 of 3 positions shown; findings below may reference images not displayed]

FINDINGS: Mild fracture T12 appears chronic and unchanged with associated
anterior spurring at T11-12.

No acute thoracic fracture.  Remaining disc spaces intact.

Vagal stimulator noted in the left neck.
IMPRESSION: Negative for acute fracture.  Chronic fracture T12.

## 2019-10-13 ENCOUNTER — Encounter (HOSPITAL_COMMUNITY): Payer: Self-pay | Admitting: Physical Therapy

## 2019-10-13 ENCOUNTER — Other Ambulatory Visit: Payer: Self-pay

## 2019-10-13 ENCOUNTER — Ambulatory Visit (HOSPITAL_COMMUNITY): Payer: No Typology Code available for payment source

## 2019-10-13 ENCOUNTER — Ambulatory Visit (HOSPITAL_COMMUNITY): Payer: No Typology Code available for payment source | Attending: Adult Health | Admitting: Physical Therapy

## 2019-10-13 DIAGNOSIS — R262 Difficulty in walking, not elsewhere classified: Secondary | ICD-10-CM | POA: Diagnosis not present

## 2019-10-13 DIAGNOSIS — Z9181 History of falling: Secondary | ICD-10-CM | POA: Diagnosis present

## 2019-10-13 NOTE — Therapy (Signed)
[facility] [address] [place], [place], [address] Phone: [phone]   Fax:  [phone]  Physical Therapy Evaluation  Patient Details  Name: Eric [name] MRN: [id number] Date of Birth: 07/21/1956 Referring Provider (PT): Leodis Sias   Encounter Date: 10/13/2019  PT End of Session - 10/13/19 0826    Visit Number  1    Number of Visits  16    Date for PT Re-Evaluation  12/04/19    Authorization Type  [facility] [employer]. Medicare - follow medicare guidelines    Authorization Time Period  15 visits approved by [facility] with verification dates 08/06/2019 to 12/04/2019. POC dates 10/13/19 to 12/08/19    Authorization - Visit Number  1    Authorization - Number of Visits  15    PT Start Time  0830    PT Stop Time  0917    PT Time Calculation (min)  47 min       Past Medical History:  Diagnosis Date  . A-fib (HCC)   . Hypercholesteremia   . Seizures (HCC)     Past Surgical History:  Procedure Laterality Date  . APPENDECTOMY    . CARDIAC ELECTROPHYSIOLOGY STUDY AND ABLATION    . IMPLANTATION VAGAL NERVE STIMULATOR  03/2017    There were no vitals filed for this visit.   Subjective Assessment - 10/13/19 1020    Subjective  Patient reports he falls a lot and fell not too long ago where he had to go to the hospital. States that he has a history of seizures, they happen about once every 2 months and that his last one was about 3 weeks ago. States that he has gaps in his memory, unsure of when/how ramp was put in when he went to the hospital a while back, and not sure why he went to the hospital but they did put him in a wheelchair for a short time. States It may have been from a seizure, he is unsure. States he walks to feed his dogs down the hill and has no difficulty with this, but states coming back up the hill he has to stop and catch his breath a couple times. States he uses a cane where ever he goes as he feels safer. When he falls  he feels like he trips up on something. Would like to fall less.    Limitations  Standing;Walking;House hold activities    How long can you sit comfortably?  no difficulties    How long can you stand comfortably?  depends    How long can you walk comfortably?  a couple minutes    Patient Stated Goals  to have less falls    Currently in Pain?  Yes    Pain Score  4     Pain Location  Back    Pain Frequency  Occasional    Aggravating Factors   walking/moving    Multiple Pain Sites  Yes    Pain Score  2    Pain Location  Knee    Pain Orientation  Right    Aggravating Factors   walking/moving         [facility] PT Assessment - 10/13/19 0001      Assessment   Medical Diagnosis  gait disorder, falls    Referring Provider (PT)  Leodis Sias      Balance Screen   Has the patient fallen in the past 6 months  Yes  How many times?  4    Has the patient had a decrease in activity level because of a fear of falling?   Yes    Is the patient reluctant to leave their home because of a fear of falling?   No      Home Social worker  Private residence    Living Arrangements  Spouse/significant other;Children    [place] - 2 wheels;Cane - single point   hospital bed, toilet chair     Prior Function   Level of Independence  Independent with household mobility with device      Cognition   Overall Cognitive Status  History of cognitive impairments - at baseline    Attention  Focused      Observation/Other Assessments   Focus on Therapeutic Outcomes (FOTO)   49% limited       ROM / Strength   AROM / PROM / Strength  Strength      Strength   Strength Assessment Site  Hip;Knee;Ankle    Right/Left Hip  Right;Left    Right/Left Knee  Right;Left    Right/Left Ankle  Right;Left      Ambulation/Gait   Ambulation/Gait  Yes    Ambulation/Gait Assistance  6: Modified independent (Device/Increase  time);5: Supervision    Ambulation Distance (Feet)  302 Feet    Assistive device  Straight cane    Gait Pattern  Decreased arm swing - right;Decreased arm swing - left;Decreased hip/knee flexion - right;Decreased hip/knee flexion - left;Narrow base of support;Trunk flexed    Ambulation Surface  Level;Indoor    Gait velocity  WNL    Stairs  Yes    Stair Management Technique  One rail Left;Step to pattern;With cane   step to pattern down, step through pattern up   Number of Stairs  4    Height of Stairs  7    Gait Comments  2MW      Balance   Balance Assessed  Yes      Static Standing Balance   Static Standing Balance -  Activities   Single Leg Stance - Right Leg;Single Leg Stance - Left Leg;Tandam Stance - Right Leg;Tandam Stance - Left Leg   Narrow BOS   Static Standing - Comment/# of Minutes  SLS 5 sec L and 6 sec R- mod sway both; Narrow BOS 30 sec mod trunk sway; Tandem L in front 8 sec, Tandem R in front 5 seconds - max trunk sway arms out with both      Standardized Balance Assessment   Standardized Balance Assessment  Dynamic Gait Index      Dynamic Gait Index   Level Surface  Mild Impairment    Change in Gait Speed  Normal    Gait with Horizontal Head Turns  Moderate Impairment    Gait with Vertical Head Turns  Moderate Impairment    Gait and Pivot Turn  Normal    Step Over Obstacle  Mild Impairment    Step Around Obstacles  Normal    Steps  Moderate Impairment    Total Score  16                Objective measurements completed on examination: See above findings.              PT Education - 10/13/19 1022    Education Details  on  current condition, balance findings and plan moving forward.    Person(s) Educated  Patient    Methods  Explanation    Comprehension  Verbalized understanding       PT Short Term Goals - 10/13/19 1016      PT SHORT TERM GOAL #1   Title  Patient will be independent in HEP to improve functional outcomes.    Time  4     Period  Weeks    Status  New    Target Date  11/10/19      PT SHORT TERM GOAL #2   Title  Patient will be able to stand on either leg for at least 10 seconds without UE support to demonstrate improved static balance.    Time  4    Period  Weeks    Status  New    Target Date  11/10/19        PT Long Term Goals - 10/13/19 1018      PT LONG TERM GOAL #1   Title  Patient will be able to report at least 25% improvement in overall balance to demonstrate improved confidence in balance.    Time  8    Period  Weeks    Status  New    Target Date  12/08/19      PT LONG TERM GOAL #2   Title  Patient will score at least 20 on DGI to decrease risk of falls.    Time  8    Period  Weeks    Status  New    Target Date  12/08/19      PT LONG TERM GOAL #3   Title  Patient will be able to stand in tandem for at least 15 seconds with either leg posterior to demonstrate improved static balance.    Time  8    Period  Weeks    Status  New    Target Date  12/08/19             Plan - 10/13/19 1005    Clinical Impression Statement  Patient presents to physical therapy with complaint of balance issues and history of repeated falls. Comorbidities of Parkinson's disease and history of seizures are important to note as they will likely contribute to patient's progress and presentation while in therapy. Patient demonstrated static and dynamic balance impairments during today's session and his current DGI score puts him at risk for additional falls. Educated patient on current presentation and plan moving forward. Patient would benefit from skilled physical therapy focusing on gait and balance to decrease overall risk of falling at home.    Personal Factors and Comorbidities  Age;Comorbidity 1;Comorbidity 2;Comorbidity 3+    Comorbidities  hx of seizures, vagal nerve stimulator, PD, back and neck pain, heart attack    Examination-Activity Limitations  Bend;Continence;Lift;Reach  Overhead;Squat;Stand;Transfers    Examination-Participation Restrictions  Community Activity;Yard Work;Cleaning    Stability/Clinical Decision Making  Evolving/Moderate complexity    Clinical Decision Making  Moderate    Rehab Potential  Good    PT Frequency  2x / week    PT Duration  8 weeks    PT Treatment/Interventions  ADLs/Self Care Home Management;Aquatic Therapy;Cryotherapy;Electrical Stimulation;Moist Heat;Therapeutic exercise;Therapeutic activities;Functional mobility training;Stair training;Gait training;Balance training;Neuromuscular re-education;Patient/family education;Manual techniques;Energy conservation;Passive range of motion    PT Next Visit Plan  establish HEP, work on functional strengthening, balance. functional endurance    PT Home Exercise Plan  initiate next session    Consulted and  Agree with Plan of Care  Patient       Patient will benefit from skilled therapeutic intervention in order to improve the following deficits and impairments:  Abnormal gait, Decreased activity tolerance, Decreased balance, Decreased range of motion, Difficulty walking, Decreased strength, Decreased mobility, Decreased endurance, Pain, Impaired vision/preception  Visit Diagnosis: Difficulty in walking, not elsewhere classified  History of falling     Problem List There are no active problems to display for this patient.  10:29 AM, 10/13/19 Tereasa Coop, DPT Physical Therapy with [facility]  [phone] office  [facility] [address] [place], [place], [address] Phone: [phone]   Fax:  [phone]  Name: Eric [name] MRN: [id number] Date of Birth: [birth year]

## 2019-10-19 ENCOUNTER — Telehealth (HOSPITAL_COMMUNITY): Payer: Self-pay | Admitting: Physical Therapy

## 2019-10-19 ENCOUNTER — Encounter (HOSPITAL_COMMUNITY): Payer: Self-pay | Admitting: Physical Therapy

## 2019-10-19 ENCOUNTER — Other Ambulatory Visit: Payer: Self-pay

## 2019-10-19 ENCOUNTER — Ambulatory Visit (HOSPITAL_COMMUNITY): Payer: No Typology Code available for payment source | Admitting: Physical Therapy

## 2019-10-19 DIAGNOSIS — Z9181 History of falling: Secondary | ICD-10-CM

## 2019-10-19 DIAGNOSIS — R262 Difficulty in walking, not elsewhere classified: Secondary | ICD-10-CM | POA: Diagnosis not present

## 2019-10-19 NOTE — Therapy (Signed)
[facility] [address] [place], [place], [address] Phone: [phone]   Fax:  [phone]  Physical Therapy Treatment  Patient Details  Name: Eric [name] MRN: [id number] Date of Birth: 1956/07/04 Referring Provider (PT): Leodis SiasMary Claire Ellis   Encounter Date: 10/19/2019  PT End of Session - 10/19/19 0940    Visit Number  2    Number of Visits  16    Date for PT Re-Evaluation  12/04/19    Authorization Type  [facility] [employer]. Medicare - follow medicare guidelines    Authorization Time Period  15 visits approved by VA with verification dates 08/06/2019 to 12/04/2019. POC dates 10/13/19 to 12/08/19    Authorization - Visit Number  2    Authorization - Number of Visits  15    PT Start Time  [phone]    PT Stop Time  1000    PT Time Calculation (min)  44 min       Past Medical History:  Diagnosis Date  . A-fib (HCC)   . Hypercholesteremia   . Seizures (HCC)     Past Surgical History:  Procedure Laterality Date  . APPENDECTOMY    . CARDIAC ELECTROPHYSIOLOGY STUDY AND ABLATION    . IMPLANTATION VAGAL NERVE STIMULATOR  03/2017    There were no vitals filed for this visit.  Subjective Assessment - 10/19/19 0917    Subjective  states the exercise has been bothering his right knee (sit to stand). Reports no alls since last session. Brought in medical history he did not remember at the time of his last appointment.VNS 2018 and heart cath 2017    Limitations  Standing;Walking;House hold activities    How long can you sit comfortably?  no difficulties    How long can you stand comfortably?  depends    How long can you walk comfortably?  a couple minutes    Patient Stated Goals  to have less falls    Currently in Pain?  Yes    Pain Score  6     Pain Location  Knee    Pain Orientation  Right         [facility] PT Assessment - 10/19/19 0001      Assessment   Medical Diagnosis  gait disorder, falls    Referring Provider (PT)  Leodis SiasMary  Claire Ellis                   [facility] Treatment/Exercise - 10/19/19 0001      Ambulation/Gait   Ambulation/Gait  Yes    Ambulation/Gait Assistance  6: Modified independent (Device/Increase time)    Ambulation Distance (Feet)  --   150   Assistive device  Straight cane    Gait Pattern  Decreased arm swing - right;Decreased arm swing - left;Decreased hip/knee flexion - right;Decreased hip/knee flexion - left;Narrow base of support;Trunk flexed    Ambulation Surface  Level;Indoor      Exercises   Exercises  Knee/Hip      Knee/Hip Exercises: Standing   Lateral Step Up  Both;3 sets;10 reps;Step Height: 6"    Other Standing Knee Exercises  tandem on floor 3x30" Bilat; lateral stepping in mini squat 4x 15 ft bilat6    Other Standing Knee Exercises  foot taps on 6" step 2x10 bilat      Manual Therapy   Manual Therapy  Soft tissue mobilization    Manual therapy comments  all manual interventions performed independently of  other exercises    Soft tissue mobilization  STM to right distal quad and ITB - tolerated moderately well               PT Short Term Goals - 10/13/19 1016      PT SHORT TERM GOAL #1   Title  Patient will be independent in HEP to improve functional outcomes.    Time  4    Period  Weeks    Status  New    Target Date  11/10/19      PT SHORT TERM GOAL #2   Title  Patient will be able to stand on either leg for at least 10 seconds without UE support to demonstrate improved static balance.    Time  4    Period  Weeks    Status  New    Target Date  11/10/19        PT Long Term Goals - 10/13/19 1018      PT LONG TERM GOAL #1   Title  Patient will be able to report at least 25% improvement in overall balance to demonstrate improved confidence in balance.    Time  8    Period  Weeks    Status  New    Target Date  12/08/19      PT LONG TERM GOAL #2   Title  Patient will score at least 20 on DGI to decrease risk of falls.    Time  8     Period  Weeks    Status  New    Target Date  12/08/19      PT LONG TERM GOAL #3   Title  Patient will be able to stand in tandem for at least 15 seconds with either leg posterior to demonstrate improved static balance.    Time  8    Period  Weeks    Status  New    Target Date  12/08/19            Plan - 10/19/19 1009    Clinical Impression Statement  Patient reported right knee pain at beginning of session and this decreased with exercises. Two thirds of the way through the session he reported increased knee pain. Transitioned to manual therapy and walking and this decreased his pain back to a 3/10. Patietn easily fatigued but able to perform all exerciss without difficulty. Took long rest breaks for patient to catch breath between sets. Will continue to work on LE strength, endurance and balance as tolerated.    Personal Factors and Comorbidities  Age;Comorbidity 1;Comorbidity 2;Comorbidity 3+    Comorbidities  hx of seizures, vagal nerve stimulator, PD, back and neck pain, heart attack    Examination-Activity Limitations  Bend;Continence;Lift;Reach Overhead;Squat;Stand;Transfers    Examination-Participation Restrictions  Community Activity;Yard Work;Cleaning    Stability/Clinical Decision Making  Evolving/Moderate complexity    Rehab Potential  Good    PT Frequency  2x / week    PT Duration  8 weeks    PT Treatment/Interventions  ADLs/Self Care Home Management;Aquatic Therapy;Cryotherapy;Electrical Stimulation;Moist Heat;Therapeutic exercise;Therapeutic activities;Functional mobility training;Stair training;Gait training;Balance training;Neuromuscular re-education;Patient/family education;Manual techniques;Energy conservation;Passive range of motion    PT Next Visit Plan  work on functional strengthening, balance. functional endurance    PT Home Exercise Plan  11/10 - lateral stepping at rail    Consulted and Agree with Plan of Care  Patient       Patient will benefit from  skilled therapeutic intervention in order to  improve the following deficits and impairments:  Abnormal gait, Decreased activity tolerance, Decreased balance, Decreased range of motion, Difficulty walking, Decreased strength, Decreased mobility, Decreased endurance, Pain, Impaired vision/preception  Visit Diagnosis: Difficulty in walking, not elsewhere classified  History of falling     Problem List There are no active problems to display for this patient.  10:19 AM, 10/19/19 Jerene Pitch, DPT Physical Therapy with [facility]  [phone] office  [place] [address] [place], [place], [address] Phone: [phone]   Fax:  [phone]  Name: Eric [name] MRN: [id number] Date of Birth: 11-Jun-1956

## 2019-10-19 NOTE — Telephone Encounter (Signed)
Talked with Elberta Fortis with [facility] - also called [facility] and l/m for the [facility] Team to call me back to get the correct NPI# for Jolyne Loa MD. I was given NPI #[id number] and Elberta Fortis states that is incorrect. NF 10/19/2019.

## 2019-10-21 ENCOUNTER — Ambulatory Visit (HOSPITAL_COMMUNITY): Payer: No Typology Code available for payment source | Admitting: Physical Therapy

## 2019-10-21 ENCOUNTER — Encounter (HOSPITAL_COMMUNITY): Payer: Self-pay | Admitting: Physical Therapy

## 2019-10-21 ENCOUNTER — Other Ambulatory Visit: Payer: Self-pay

## 2019-10-21 DIAGNOSIS — R262 Difficulty in walking, not elsewhere classified: Secondary | ICD-10-CM

## 2019-10-21 DIAGNOSIS — Z9181 History of falling: Secondary | ICD-10-CM

## 2019-10-21 NOTE — Therapy (Signed)
[facility] [address] [place], [place], [address] Phone: [phone]   Fax:  [phone]  Physical Therapy Treatment  Patient Details  Name: Eric [name] MRN: [id number] Date of Birth: 1956-02-22 Referring Provider (PT): Leodis Sias   Encounter Date: 10/21/2019  PT End of Session - 10/21/19 1040    Visit Number  3    Number of Visits  16    Date for PT Re-Evaluation  12/04/19    Authorization Type  [facility] [employer]. Medicare - follow medicare guidelines    Authorization Time Period  15 visits approved by VA with verification dates 08/06/2019 to 12/04/2019. POC dates 10/13/19 to 12/08/19    Authorization - Visit Number  3    Authorization - Number of Visits  15    PT Start Time  1040    PT Stop Time  1120    PT Time Calculation (min)  40 min    Activity Tolerance  Patient tolerated treatment well    Behavior During Therapy  WFL for tasks assessed/performed       Past Medical History:  Diagnosis Date  . A-fib (HCC)   . Hypercholesteremia   . Seizures (HCC)     Past Surgical History:  Procedure Laterality Date  . APPENDECTOMY    . CARDIAC ELECTROPHYSIOLOGY STUDY AND ABLATION    . IMPLANTATION VAGAL NERVE STIMULATOR  03/2017    There were no vitals filed for this visit.  Subjective Assessment - 10/21/19 1041    Subjective  Patient says he practiced going up and down his ramp to his home. Patient says he did not slip or fall, but noted some increased soreness in RT thigh this morning.    Limitations  Standing;Walking;House hold activities    How long can you sit comfortably?  no difficulties    How long can you stand comfortably?  depends    How long can you walk comfortably?  a couple minutes    Patient Stated Goals  to have less falls    Currently in Pain?  Yes    Pain Score  2     Pain Location  Leg    Pain Orientation  Right;Anterior    Pain Descriptors / Indicators  Sore    Pain Type  Acute pain    Pain  Onset  Yesterday                       [facility] Adult PT Treatment/Exercise - 10/21/19 0001      Knee/Hip Exercises: Standing   Heel Raises  Both;20 reps    Heel Raises Limitations  toe raises x 20, both     Hip Abduction  Both;20 reps    Hip Extension  Both;20 reps    Lateral Step Up  Both;20 reps;Step Height: 4";Hand Hold: 2    Forward Step Up  Both;20 reps;Step Height: 4";Hand Hold: 2    Other Standing Knee Exercises  tandem on floor 3x30" Bilat; lateral stepping in mini squat in // bars 3 x RT. no HHA    Other Standing Knee Exercises  foot taps on 4" step 2x10 bilat             PT Education - 10/21/19 1125    Education Details  Patient educated on exercise technique    Person(s) Educated  Patient    Methods  Explanation;Demonstration    Comprehension  Verbalized understanding;Returned demonstration  PT Short Term Goals - 10/13/19 1016      PT SHORT TERM GOAL #1   Title  Patient will be independent in HEP to improve functional outcomes.    Time  4    Period  Weeks    Status  New    Target Date  11/10/19      PT SHORT TERM GOAL #2   Title  Patient will be able to stand on either leg for at least 10 seconds without UE support to demonstrate improved static balance.    Time  4    Period  Weeks    Status  New    Target Date  11/10/19        PT Long Term Goals - 10/13/19 1018      PT LONG TERM GOAL #1   Title  Patient will be able to report at least 25% improvement in overall balance to demonstrate improved confidence in balance.    Time  8    Period  Weeks    Status  New    Target Date  12/08/19      PT LONG TERM GOAL #2   Title  Patient will score at least 20 on DGI to decrease risk of falls.    Time  8    Period  Weeks    Status  New    Target Date  12/08/19      PT LONG TERM GOAL #3   Title  Patient will be able to stand in tandem for at least 15 seconds with either leg posterior to demonstrate improved static balance.    Time  8     Period  Weeks    Status  New    Target Date  12/08/19            Plan - 10/21/19 1152    Clinical Impression Statement  Patient declined manual treatment today, stating that he did not feel he neded it. Patient was able to progress BLE strengthening today, but di drequire several nbreaks for rest due to fatigue. Patient dmeos good static balance with tandem stance LT foot rear, but had mod sway with RT foot rear. Patient educated on proper form and function of all added ther ex. Patient did note mild discomfort in RT knee with step ups, box height reduced to 4 inch.    Personal Factors and Comorbidities  Age;Comorbidity 1;Comorbidity 2;Comorbidity 3+    Comorbidities  hx of seizures, vagal nerve stimulator, PD, back and neck pain, heart attack    Examination-Activity Limitations  Bend;Continence;Lift;Reach Overhead;Squat;Stand;Transfers    Examination-Participation Restrictions  Community Activity;Yard Work;Cleaning    Stability/Clinical Decision Making  Evolving/Moderate complexity    Rehab Potential  Good    PT Frequency  2x / week    PT Duration  8 weeks    PT Treatment/Interventions  ADLs/Self Care Home Management;Aquatic Therapy;Cryotherapy;Electrical Stimulation;Moist Heat;Therapeutic exercise;Therapeutic activities;Functional mobility training;Stair training;Gait training;Balance training;Neuromuscular re-education;Patient/family education;Manual techniques;Energy conservation;Passive range of motion    PT Next Visit Plan  Continue to progress balance and dynamic stability as tolerated. Add sit to stands if able.    PT Home Exercise Plan  11/10 - lateral stepping at rail    Consulted and Agree with Plan of Care  Patient       Patient will benefit from skilled therapeutic intervention in order to improve the following deficits and impairments:  Abnormal gait, Decreased activity tolerance, Decreased balance, Decreased range of motion, Difficulty walking, Decreased strength,  Decreased mobility, Decreased endurance, Pain, Impaired vision/preception  Visit Diagnosis: Difficulty in walking, not elsewhere classified  History of falling     Problem List There are no active problems to display for this patient.   11:59 AM, 10/21/19 Georges Lynchameron Chyane Greer PT DPT  Physical Therapist with [place]  [facility]  [phone]   [facility] [address] [place], [place], [address] Phone: [phone]   Fax:  [phone]  Name: Albertine PatriciaRobert V Murnane MRN: [id number] Date of Birth: 04/11/1956

## 2019-10-25 ENCOUNTER — Other Ambulatory Visit: Payer: Self-pay

## 2019-10-25 ENCOUNTER — Encounter (HOSPITAL_COMMUNITY): Payer: Self-pay | Admitting: Physical Therapy

## 2019-10-25 ENCOUNTER — Ambulatory Visit (HOSPITAL_COMMUNITY): Payer: No Typology Code available for payment source | Admitting: Physical Therapy

## 2019-10-25 DIAGNOSIS — R262 Difficulty in walking, not elsewhere classified: Secondary | ICD-10-CM

## 2019-10-25 DIAGNOSIS — Z9181 History of falling: Secondary | ICD-10-CM

## 2019-10-25 NOTE — Therapy (Signed)
Eric [name] [place], [place], [address] Phone: [phone]   Fax:  [phone]  Physical Therapy Treatment  Patient Details  Name: Eric [name] MRN: [id number] Date of Birth: 1956/12/08 Referring Provider (PT): Jolyne Loa   Encounter Date: 10/25/2019  PT End of Session - 10/25/19 0911    Visit Number  4    Number of Visits  16    Date for PT Re-Evaluation  12/04/19    Authorization Type  [place]. Medicare - follow medicare guidelines    Authorization Time Period  15 visits approved by [place] with verification dates 08/06/2019 to 12/04/2019. POC dates 10/13/19 to 12/08/19    Authorization - Visit Number  4    Authorization - Number of Visits  15    PT Start Time  [phone]   patient arrived late   PT Stop Time  0950    PT Time Calculation (min)  40 min    Equipment Utilized During Treatment  Gait belt    Activity Tolerance  Patient tolerated treatment well    Behavior During Therapy  WFL for tasks assessed/performed       Past Medical History:  Diagnosis Date  . A-fib ([place])   . Hypercholesteremia   . Seizures ([place])     Past Surgical History:  Procedure Laterality Date  . APPENDECTOMY    . CARDIAC ELECTROPHYSIOLOGY STUDY AND ABLATION    . IMPLANTATION VAGAL NERVE STIMULATOR  03/2017    There were no vitals filed for this visit.  Subjective Assessment - 10/25/19 0910    Subjective  Patient says he has been walking up and down his ramp at home to work on strength. Patient says his knees are sore, and his back is hurting some this morning.    Limitations  Standing;Walking;House hold activities    How long can you sit comfortably?  no difficulties    How long can you stand comfortably?  depends    How long can you walk comfortably?  a couple minutes    Patient Stated Goals  to have less falls    Currently in Pain?  Yes    Pain Score  4     Pain Location  Back    Pain Orientation  Lower;Posterior    Pain Descriptors / Indicators  Aching    Pain Type  Acute pain    Pain Onset  Yesterday                       [facility] Adult PT Treatment/Exercise - 10/25/19 0001      Knee/Hip Exercises: Standing   Heel Raises  Both;20 reps    Heel Raises Limitations  toe raises x 20, both     Hip Abduction  Both;20 reps    Hip Extension  Both;20 reps    Lateral Step Up  Both;20 reps;Hand Hold: 2;Step Height: 6"    Forward Step Up  Both;2 sets;10 reps;Hand Hold: 2;Step Height: 4";Step Height: 6"   1 set 4 inch; 1 set 6 inch   Other Standing Knee Exercises  tandem on floor 3x30" Bilat; lateral stepping at // bars 3 x RT. no HHA    Other Standing Knee Exercises  foot taps on 6" step 2x10 bilat      Knee/Hip Exercises: Seated   Sit to Sand  2 sets;without UE support;10 reps   elevated on foam  Balance Exercises - 10/25/19 0944      Balance Exercises: Standing   Tandem Gait  Forward;4 reps   15'; no AD; 2xRT staggered outside blue; 2 RT on blue line   Step Over Hurdles / Cones  3x RT; 15' no AD; over 4 6 inch hurdles           PT Short Term Goals - 10/13/19 1016      PT SHORT TERM GOAL #1   Title  Patient will be independent in HEP to improve functional outcomes.    Time  4    Period  Weeks    Status  New    Target Date  11/10/19      PT SHORT TERM GOAL #2   Title  Patient will be able to stand on either leg for at least 10 seconds without UE support to demonstrate improved static balance.    Time  4    Period  Weeks    Status  New    Target Date  11/10/19        PT Long Term Goals - 10/13/19 1018      PT LONG TERM GOAL #1   Title  Patient will be able to report at least 25% improvement in overall balance to demonstrate improved confidence in balance.    Time  8    Period  Weeks    Status  New    Target Date  12/08/19      PT LONG TERM GOAL #2   Title  Patient will score at least 20 on DGI to decrease risk of falls.    Time  8    Period  Weeks     Status  New    Target Date  12/08/19      PT LONG TERM GOAL #3   Title  Patient will be able to stand in tandem for at least 15 seconds with either leg posterior to demonstrate improved static balance.    Time  8    Period  Weeks    Status  New    Target Date  12/08/19            Plan - 10/25/19 1000    Clinical Impression Statement  Patient tolerated session well today. Patient did note mild disocmofrt/ soress in RT knee with step ups, but was able to progress step height to 6 inch step ups with no increase in pain. Patient showed improved static balance with tandem stance today, but was well challenged with progression to dynamic balance activity. Patient was unable to perform heel to toe with tandem gait initially, so activity was modified to staggered stance gait. Patient did well with this, but required verbal cueing for foot placement. Patient was able to then progress to tandem gait with heel to toe progression, but with increased difficulty. Patient required therapist Min A for correction to LOB x 2 during tandem gait. Patient noted increased BLE muscle fatigue post treatment.    Personal Factors and Comorbidities  Age;Comorbidity 1;Comorbidity 2;Comorbidity 3+    Comorbidities  hx of seizures, vagal nerve stimulator, PD, back and neck pain, heart attack    Examination-Activity Limitations  Bend;Continence;Lift;Reach Overhead;Squat;Stand;Transfers    Examination-Participation Restrictions  Community Activity;Yard Work;Cleaning    Stability/Clinical Decision Making  Evolving/Moderate complexity    Rehab Potential  Good    PT Frequency  2x / week    PT Duration  8 weeks    PT Treatment/Interventions  ADLs/Self Care  Home Management;Aquatic Therapy;Cryotherapy;Electrical Stimulation;Moist Heat;Therapeutic exercise;Therapeutic activities;Functional mobility training;Stair training;Gait training;Balance training;Neuromuscular re-education;Patient/family education;Manual techniques;Energy  conservation;Passive range of motion    PT Next Visit Plan  Progress dynamic balance, add walking with head turns. Monitor knee pain with step exercise.    PT Home Exercise Plan  11/10 - lateral stepping at rail    Consulted and Agree with Plan of Care  Patient       Patient will benefit from skilled therapeutic intervention in order to improve the following deficits and impairments:  Abnormal gait, Decreased activity tolerance, Decreased balance, Decreased range of motion, Difficulty walking, Decreased strength, Decreased mobility, Decreased endurance, Pain, Impaired vision/preception  Visit Diagnosis: Difficulty in walking, not elsewhere classified  History of falling     Problem List There are no active problems to display for this patient.  10:06 AM, 10/25/19 Georges Lynchameron Theodus Ran PT DPT  Physical Therapist with [place]  [facility]  [phone]   [facility] [address] [place] [place], [place], [address] Phone: [phone]   Fax:  [phone]  Name: Albertine PatriciaRobert V Ravan MRN: [id number] Date of Birth: 1956/03/02

## 2019-10-27 ENCOUNTER — Ambulatory Visit (HOSPITAL_COMMUNITY): Payer: No Typology Code available for payment source | Admitting: Physical Therapy

## 2019-10-27 ENCOUNTER — Encounter (HOSPITAL_COMMUNITY): Payer: Self-pay | Admitting: Physical Therapy

## 2019-10-27 ENCOUNTER — Other Ambulatory Visit: Payer: Self-pay

## 2019-10-27 DIAGNOSIS — R262 Difficulty in walking, not elsewhere classified: Secondary | ICD-10-CM | POA: Diagnosis not present

## 2019-10-27 DIAGNOSIS — Z9181 History of falling: Secondary | ICD-10-CM

## 2019-10-27 NOTE — Therapy (Signed)
[place], [place], [address] Phone: [phone]   Fax:  [phone]  Physical Therapy Treatment  Patient Details  Name: Eric [name] MRN: [id number] Date of Birth: 05-Sep-1956 Referring Provider (PT): Jolyne Loa   Encounter Date: 10/27/2019  PT End of Session - 10/27/19 1123    Visit Number  5    Number of Visits  16    Date for PT Re-Evaluation  12/04/19    Authorization Type  [place]. Medicare - follow medicare guidelines    Authorization Time Period  15 visits approved by [place] with verification dates 08/06/2019 to 12/04/2019. POC dates 10/13/19 to 12/08/19    Authorization - Visit Number  5    Authorization - Number of Visits  15    PT Start Time  1119    PT Stop Time  1157    PT Time Calculation (min)  38 min    Equipment Utilized During Treatment  Gait belt    Activity Tolerance  Patient tolerated treatment well    Behavior During Therapy  WFL for tasks assessed/performed       Past Medical History:  Diagnosis Date  . A-fib ([place])   . Hypercholesteremia   . Seizures ([place])     Past Surgical History:  Procedure Laterality Date  . APPENDECTOMY    . CARDIAC ELECTROPHYSIOLOGY STUDY AND ABLATION    . IMPLANTATION VAGAL NERVE STIMULATOR  03/2017    There were no vitals filed for this visit.  Subjective Assessment - 10/27/19 1122    Subjective  Patient says he was sore in his back the day after last session. Patient says he thinks this was from exercising and says that he had more trouble getting up from bed. Patient says he has been having his wife put "deep heat" on his back which was helpful. Patient reports current pain in back at 2/10    Limitations  Standing;Walking;House hold activities    How long can you sit comfortably?  no difficulties    How long can you stand comfortably?  depends    How long can you walk comfortably?  a couple minutes    Patient Stated Goals  to have less  falls    Currently in Pain?  Yes    Pain Score  2     Pain Location  Back    Pain Orientation  Lower    Pain Descriptors / Indicators  Aching;Sore    Pain Type  Acute pain    Pain Onset  Yesterday                       [facility] Adult PT Treatment/Exercise - 10/27/19 0001      Knee/Hip Exercises: Standing   Heel Raises  Both;20 reps    Heel Raises Limitations  toe raises x 20, both     Other Standing Knee Exercises  tandem on floor 3x30" Bilat    Other Standing Knee Exercises  foot taps on 6" step 2x10 bilat      Knee/Hip Exercises: Seated   Sit to Sand  2 sets;without UE support;10 reps          Balance Exercises - 10/27/19 1133      Balance Exercises: Standing   SLS  Eyes open;Solid surface;3 reps;15 secs    Tandem Gait  Forward;3 reps   15 feet; 3 RT   Sidestepping  2 reps   2RT,  15 feet, over 4 6inch hurdles   Step Over Hurdles / Cones  2x RT; 15' no AD; over 4 6 inch hurdles     Cone Rotation  Solid surface   Ambulating figure 8 through cones; 3 RT; 8 cones         PT Short Term Goals - 10/13/19 1016      PT SHORT TERM GOAL #1   Title  Patient will be independent in HEP to improve functional outcomes.    Time  4    Period  Weeks    Status  New    Target Date  11/10/19      PT SHORT TERM GOAL #2   Title  Patient will be able to stand on either leg for at least 10 seconds without UE support to demonstrate improved static balance.    Time  4    Period  Weeks    Status  New    Target Date  11/10/19        PT Long Term Goals - 10/13/19 1018      PT LONG TERM GOAL #1   Title  Patient will be able to report at least 25% improvement in overall balance to demonstrate improved confidence in balance.    Time  8    Period  Weeks    Status  New    Target Date  12/08/19      PT LONG TERM GOAL #2   Title  Patient will score at least 20 on DGI to decrease risk of falls.    Time  8    Period  Weeks    Status  New    Target Date  12/08/19       PT LONG TERM GOAL #3   Title  Patient will be able to stand in tandem for at least 15 seconds with either leg posterior to demonstrate improved static balance.    Time  8    Period  Weeks    Status  New    Target Date  12/08/19            Plan - 10/27/19 1234    Clinical Impression Statement  Patient tolerated treatment well today. Patient did well with negotiating turns with added cone ambulation. Patient continues to have difficulty with static tandem stance as well as SLS. Patient required min tactile cueing for maintaining position with both. Patient also challenged with tandem gait, experiencing LOB x 2 but was corrected with therapist assist. Patient noted increased knee pain during sit to stands but subsided with rest. Patient reported no increased lumbar pain during treatment.    Personal Factors and Comorbidities  Age;Comorbidity 1;Comorbidity 2;Comorbidity 3+    Comorbidities  hx of seizures, vagal nerve stimulator, PD, back and neck pain, heart attack    Examination-Activity Limitations  Bend;Continence;Lift;Reach Overhead;Squat;Stand;Transfers    Examination-Participation Restrictions  Community Activity;Yard Work;Cleaning    Stability/Clinical Decision Making  Evolving/Moderate complexity    Rehab Potential  Good    PT Frequency  2x / week    PT Duration  8 weeks    PT Treatment/Interventions  ADLs/Self Care Home Management;Aquatic Therapy;Cryotherapy;Electrical Stimulation;Moist Heat;Therapeutic exercise;Therapeutic activities;Functional mobility training;Stair training;Gait training;Balance training;Neuromuscular re-education;Patient/family education;Manual techniques;Energy conservation;Passive range of motion    PT Next Visit Plan  Continue to progress BLE strength as tolerated by lumbar and knee pain.    PT Home Exercise Plan  11/10 - lateral stepping at rail    Consulted and Agree with  Plan of Care  Patient       Patient will benefit from skilled therapeutic  intervention in order to improve the following deficits and impairments:  Abnormal gait, Decreased activity tolerance, Decreased balance, Decreased range of motion, Difficulty walking, Decreased strength, Decreased mobility, Decreased endurance, Pain, Impaired vision/preception  Visit Diagnosis: Difficulty in walking, not elsewhere classified  History of falling     Problem List There are no active problems to display for this patient.  12:41 PM, 10/27/19 Georges Lynchameron Waylon Hershey PT DPT  Physical Therapist with [place]  [facility]  [phone]   [facility] [address] [place], [place], [address] Phone: [phone]   Fax:  [phone]  Name: Albertine PatriciaRobert V Carda MRN: [id number] Date of Birth: January 02, 1956

## 2019-11-02 ENCOUNTER — Encounter (HOSPITAL_COMMUNITY): Payer: Self-pay | Admitting: Physical Therapy

## 2019-11-02 ENCOUNTER — Other Ambulatory Visit: Payer: Self-pay

## 2019-11-02 ENCOUNTER — Ambulatory Visit (HOSPITAL_COMMUNITY): Payer: No Typology Code available for payment source | Admitting: Physical Therapy

## 2019-11-02 DIAGNOSIS — R262 Difficulty in walking, not elsewhere classified: Secondary | ICD-10-CM | POA: Diagnosis not present

## 2019-11-02 DIAGNOSIS — Z9181 History of falling: Secondary | ICD-10-CM

## 2019-11-02 NOTE — Therapy (Signed)
[facility] [address] [place], [place], [address] Phone: [phone]   Fax:  [phone]  Physical Therapy Treatment  Patient Details  Name: Eric [name] MRN: [id number] Date of Birth: January 03, 1956 Referring Provider (PT): Leodis SiasMary Claire Ellis   Encounter Date: 11/02/2019  PT End of Session - 11/02/19 1138    Visit Number  6    Number of Visits  16    Date for PT Re-Evaluation  12/04/19    Authorization Type  [facility]. Medicare - follow medicare guidelines    Authorization Time Period  15 [facility] by [facility] with verification dates 08/06/2019 to 12/04/2019. POC dates 10/13/19 to 12/08/19    Authorization - Visit Number  6    Authorization - Number of Visits  15    PT Start Time  1135   patient arrived late   PT Stop Time  1200    PT Time Calculation (min)  25 min    Equipment Utilized During Treatment  Gait belt    Activity Tolerance  Patient tolerated treatment well    Behavior During Therapy  WFL for tasks assessed/performed       Past Medical History:  Diagnosis Date  . A-fib ([facility])   . Hypercholesteremia   . Seizures ([facility])     Past Surgical History:  Procedure Laterality Date  . APPENDECTOMY    . CARDIAC ELECTROPHYSIOLOGY STUDY AND ABLATION    . IMPLANTATION VAGAL NERVE STIMULATOR  03/2017    There were no vitals filed for this visit.  Subjective Assessment - 11/02/19 1133    Subjective  Patient says he is "ok" today. Patient says low back is hurting a little today, "about a 2".    Limitations  Standing;Walking;House hold activities    How long can you sit comfortably?  no difficulties    How long can you stand comfortably?  depends    How long can you walk comfortably?  a couple minutes    Patient Stated Goals  to have less falls    Currently in Pain?  Yes    Pain Score  2     Pain Location  Back    Pain Orientation  Lower;Posterior    Pain Descriptors / Indicators  Sore    Pain Onset  Yesterday                        [facility] Adult PT Treatment/Exercise - 11/02/19 0001      Knee/Hip Exercises: Machines for Training and development officertrengthening   Other Machine  Machine walkouts 40# x10      Knee/Hip Exercises: Standing   Heel Raises  Both;20 reps    Hip Abduction  Both;20 reps    Abduction Limitations  red band     Hip Extension  Both;20 reps    Extension Limitations  red band     Other Standing Knee Exercises  tandem on foam 3x30" Bilat      Knee/Hip Exercises: Seated   Sit to Sand  2 sets;10 reps;without UE support          Balance Exercises - 11/02/19 1142      Balance Exercises: Standing   SLS  --    Rockerboard  Lateral   2 minutes no UE assist   Tandem Gait  Forward;3 reps          PT Short Term Goals - 10/13/19 1016      PT SHORT TERM GOAL #  1   Title  Patient will be independent in HEP to improve functional outcomes.    Time  4    Period  Weeks    Status  New    Target Date  11/10/19      PT SHORT TERM GOAL #2   Title  Patient will be able to stand on either leg for at least 10 seconds without UE support to demonstrate improved static balance.    Time  4    Period  Weeks    Status  New    Target Date  11/10/19        PT Long Term Goals - 10/13/19 1018      PT LONG TERM GOAL #1   Title  Patient will be able to report at least 25% improvement in overall balance to demonstrate improved confidence in balance.    Time  8    Period  Weeks    Status  New    Target Date  12/08/19      PT LONG TERM GOAL #2   Title  Patient will score at least 20 on DGI to decrease risk of falls.    Time  8    Period  Weeks    Status  New    Target Date  12/08/19      PT LONG TERM GOAL #3   Title  Patient will be able to stand in tandem for at least 15 seconds with either leg posterior to demonstrate improved static balance.    Time  8    Period  Weeks    Status  New    Target Date  12/08/19            Plan - 11/02/19 1206    Clinical Impression Statement   Patient arrived 20 minutes late to todays appointment. Ther ex abbreviated per flow sheet. Patient shows improvement in static and dynamic balance and was able to progress tandem stance to foam. Also able to progress BLE strengthening with red band added to standing hip abd and extension. Patient reports no increased complaint of pain, but did require verbal cueing to avoid leaning trunk/ compensation when performing banded hip abd/ extension in standing.    Personal Factors and Comorbidities  Age;Comorbidity 1;Comorbidity 2;Comorbidity 3+    Comorbidities  hx of seizures, vagal nerve stimulator, PD, back and neck pain, heart attack    Examination-Activity Limitations  Bend;Continence;Lift;Reach Overhead;Squat;Stand;Transfers    Examination-Participation Restrictions  Community Activity;Yard Work;Cleaning    Stability/Clinical Decision Making  Evolving/Moderate complexity    Rehab Potential  Good    PT Frequency  2x / week    PT Duration  8 weeks    PT Treatment/Interventions  ADLs/Self Care Home Management;Aquatic Therapy;Cryotherapy;Electrical Stimulation;Moist Heat;Therapeutic exercise;Therapeutic activities;Functional mobility training;Stair training;Gait training;Balance training;Neuromuscular re-education;Patient/family education;Manual techniques;Energy conservation;Passive range of motion    PT Next Visit Plan  Progress BLE strength and balance as able.    PT Home Exercise Plan  11/10 - lateral stepping at rail    Consulted and Agree with Plan of Care  Patient       Patient will benefit from skilled therapeutic intervention in order to improve the following deficits and impairments:  Abnormal gait, Decreased activity tolerance, Decreased balance, Decreased range of motion, Difficulty walking, Decreased strength, Decreased mobility, Decreased endurance, Pain, Impaired vision/preception  Visit Diagnosis: Difficulty in walking, not elsewhere classified  History of falling     Problem  List There are no active problems to  display for this patient.  12:10 PM, 11/02/19 Josue Hector PT DPT  Physical Therapist with [facility]  [phone] [address] [place], [place], [address] Phone: [phone]   Fax:  [phone]  Name: Eric [name] MRN: [id number] Date of Birth: [birth year]

## 2019-11-03 ENCOUNTER — Ambulatory Visit (HOSPITAL_COMMUNITY): Payer: No Typology Code available for payment source | Admitting: Physical Therapy

## 2019-11-03 ENCOUNTER — Encounter (HOSPITAL_COMMUNITY): Payer: Self-pay | Admitting: Physical Therapy

## 2019-11-03 DIAGNOSIS — Z9181 History of falling: Secondary | ICD-10-CM

## 2019-11-03 DIAGNOSIS — R262 Difficulty in walking, not elsewhere classified: Secondary | ICD-10-CM | POA: Diagnosis not present

## 2019-11-03 NOTE — Therapy (Signed)
[place], [place], [address] Phone: [phone]   Fax:  [phone]  Physical Therapy Treatment  Patient Details  Name: Eric [name] MRN: [id number] Date of Birth: 1956/01/06 Referring Provider (PT): Jolyne Loa   Encounter Date: 11/03/2019  PT End of Session - 11/03/19 1136    Visit Number  7    Number of Visits  16    Date for PT Re-Evaluation  12/04/19    Authorization Type  [place]. Medicare - follow medicare guidelines    Authorization Time Period  15 visits approved by [place] with verification dates 08/06/2019 to 12/04/2019. POC dates 10/13/19 to 12/08/19    Authorization - Visit Number  7    Authorization - Number of Visits  15    PT Start Time  1132   Patient arrived late today   PT Stop Time  1205    PT Time Calculation (min)  33 min    Equipment Utilized During Treatment  Gait belt    Activity Tolerance  Patient tolerated treatment well    Behavior During Therapy  WFL for tasks assessed/performed       Past Medical History:  Diagnosis Date  . A-fib ([place])   . Hypercholesteremia   . Seizures ([place])     Past Surgical History:  Procedure Laterality Date  . APPENDECTOMY    . CARDIAC ELECTROPHYSIOLOGY STUDY AND ABLATION    . IMPLANTATION VAGAL NERVE STIMULATOR  03/2017    There were no vitals filed for this visit.  Subjective Assessment - 11/03/19 1135    Subjective  Patient says pain in back is "about a 2" otherwise reports no new issues. Patient says he was a little sore after yesterdays session.    Limitations  Standing;Walking;House hold activities    How long can you sit comfortably?  no difficulties    How long can you stand comfortably?  depends    How long can you walk comfortably?  a couple minutes    Patient Stated Goals  to have less falls    Pain Score  2     Pain Location  Back    Pain Orientation  Posterior;Lower    Pain Descriptors / Indicators  Sore                        [facility] Adult PT Treatment/Exercise - 11/03/19 0001      Knee/Hip Exercises: Standing   Heel Raises  Both;20 reps    Hip Abduction  Both;20 reps    Abduction Limitations  red band     Hip Extension  Both;20 reps    Extension Limitations  red band     Other Standing Knee Exercises  tandem on foam 3x30" Bilat      Knee/Hip Exercises: Seated   Sit to Sand  2 sets;10 reps;without UE support          Balance Exercises - 11/03/19 1147      Balance Exercises: Standing   Tandem Stance  Eyes open;Foam/compliant surface;2 reps;30 secs    SLS with Vectors  3 reps;10 secs;Solid surface;Upper extremity assist 1    Rockerboard  Lateral   2 minutes no UE assist   Tandem Gait  Forward;2 reps    Sidestepping  2 reps    Step Over Hurdles / Cones  2x RT; 15' no AD; over 4 6 inch hurdles; 2 x RT 4 12 inch hurdles  PT Short Term Goals - 10/13/19 1016      PT SHORT TERM GOAL #1   Title  Patient will be independent in HEP to improve functional outcomes.    Time  4    Period  Weeks    Status  New    Target Date  11/10/19      PT SHORT TERM GOAL #2   Title  Patient will be able to stand on either leg for at least 10 seconds without UE support to demonstrate improved static balance.    Time  4    Period  Weeks    Status  New    Target Date  11/10/19        PT Long Term Goals - 10/13/19 1018      PT LONG TERM GOAL #1   Title  Patient will be able to report at least 25% improvement in overall balance to demonstrate improved confidence in balance.    Time  8    Period  Weeks    Status  New    Target Date  12/08/19      PT LONG TERM GOAL #2   Title  Patient will score at least 20 on DGI to decrease risk of falls.    Time  8    Period  Weeks    Status  New    Target Date  12/08/19      PT LONG TERM GOAL #3   Title  Patient will be able to stand in tandem for at least 15 seconds with either leg posterior to demonstrate improved static  balance.    Time  8    Period  Weeks    Status  New    Target Date  12/08/19            Plan - 11/03/19 1251    Clinical Impression Statement  Patient arrived late to today's appointment. Patient had increased difficulty with static balance activity today and required tactile cueing for maintain balance during tandem stance on foam. Patient was well challenged with addition of vectors to SLS, and required HHA x 1 to keep balance. Patient denies increase in knee or lumbar pain this date.    Personal Factors and Comorbidities  Age;Comorbidity 1;Comorbidity 2;Comorbidity 3+    Comorbidities  hx of seizures, vagal nerve stimulator, PD, back and neck pain, heart attack    Examination-Activity Limitations  Bend;Continence;Lift;Reach Overhead;Squat;Stand;Transfers    Examination-Participation Restrictions  Community Activity;Yard Work;Cleaning    Stability/Clinical Decision Making  Evolving/Moderate complexity    Rehab Potential  Good    PT Frequency  2x / week    PT Duration  8 weeks    PT Treatment/Interventions  ADLs/Self Care Home Management;Aquatic Therapy;Cryotherapy;Electrical Stimulation;Moist Heat;Therapeutic exercise;Therapeutic activities;Functional mobility training;Stair training;Gait training;Balance training;Neuromuscular re-education;Patient/family education;Manual techniques;Energy conservation;Passive range of motion    PT Next Visit Plan  Progress BLE strength and balance as able.    PT Home Exercise Plan  11/10 - lateral stepping at rail    Consulted and Agree with Plan of Care  Patient       Patient will benefit from skilled therapeutic intervention in order to improve the following deficits and impairments:  Abnormal gait, Decreased activity tolerance, Decreased balance, Decreased range of motion, Difficulty walking, Decreased strength, Decreased mobility, Decreased endurance, Pain, Impaired vision/preception  Visit Diagnosis: Difficulty in walking, not elsewhere  classified  History of falling     Problem List There are no active problems to display  for this patient.  1:00 PM, 11/03/19 Eric [name] PT DPT  Physical Therapist with [facility]  [facility]  [phone]    [facility] [address] [place], [place], [address] Phone: [phone]   Fax:  [phone]  Name: Eric [name] MRN: [id number] Date of Birth: 09-06-1956

## 2019-11-08 ENCOUNTER — Encounter (HOSPITAL_COMMUNITY): Payer: Self-pay | Admitting: Physical Therapy

## 2019-11-08 ENCOUNTER — Ambulatory Visit (HOSPITAL_COMMUNITY): Payer: No Typology Code available for payment source | Admitting: Physical Therapy

## 2019-11-08 ENCOUNTER — Other Ambulatory Visit: Payer: Self-pay

## 2019-11-08 DIAGNOSIS — Z9181 History of falling: Secondary | ICD-10-CM

## 2019-11-08 DIAGNOSIS — R262 Difficulty in walking, not elsewhere classified: Secondary | ICD-10-CM

## 2019-11-08 NOTE — Therapy (Signed)
[place], [place], [address] Phone: [phone]   Fax:  [phone]  Physical Therapy Treatment  Patient Details  Name: Eric [name] MRN: [id number] Date of Birth: 02-07-1956 Referring Provider (PT): Jolyne Loa   Encounter Date: 11/08/2019  PT End of Session - 11/08/19 1012    Visit Number  8    Number of Visits  16    Date for PT Re-Evaluation  12/04/19    Authorization Type  [place]. Medicare - follow medicare guidelines    Authorization Time Period  15 visits approved by [place] with verification dates 08/06/2019 to 12/04/2019. POC dates 10/13/19 to 12/08/19    Authorization - Visit Number  8    Authorization - Number of Visits  15    PT Start Time  1020    PT Stop Time  1100    PT Time Calculation (min)  40 min    Equipment Utilized During Treatment  Gait belt    Activity Tolerance  Patient tolerated treatment well    Behavior During Therapy  WFL for tasks assessed/performed       Past Medical History:  Diagnosis Date  . A-fib ([place])   . Hypercholesteremia   . Seizures ([place])     Past Surgical History:  Procedure Laterality Date  . APPENDECTOMY    . CARDIAC ELECTROPHYSIOLOGY STUDY AND ABLATION    . IMPLANTATION VAGAL NERVE STIMULATOR  03/2017    There were no vitals filed for this visit.  Subjective Assessment - 11/08/19 1012    Subjective  States no pain. States he is feeling pretty good today.    Limitations  Standing;Walking;House hold activities    How long can you sit comfortably?  no difficulties    How long can you stand comfortably?  depends    How long can you walk comfortably?  a couple minutes    Patient Stated Goals  to have less falls         [facility] PT Assessment - 11/08/19 0001      Assessment   Medical Diagnosis  gait disorder, falls    Referring Provider (PT)  Jolyne Loa      Ambulation/Gait   Ambulation/Gait  Yes    Ambulation/Gait Assistance  6:  Modified independent (Device/Increase time)    Ambulation Distance (Feet)  342 Feet    Assistive device  Straight cane    Gait Pattern  Decreased arm swing - right;Decreased arm swing - left;Decreased hip/knee flexion - right;Decreased hip/knee flexion - left;Narrow base of support;Trunk flexed    Ambulation Surface  Level;Indoor                   [facility] Adult PT Treatment/Exercise - 11/08/19 0001      Knee/Hip Exercises: Standing   Other Standing Knee Exercises  naroow BOS on black foam with head rotations and nods 3x10 E and bilat; tandem on blue foam 3x30" bilat occ UE assist; lateral stepping with shoulder abd (BIG movements) 15 ft - 2x4 bilat CGA    Other Standing Knee Exercises  cone taps (3 cones) 3x5 B --> 3 cone taps in seq. without/rest 3x3 Bilat (right SLS more challenging); lateral and forward steps with forward arms - PT verbalized instructions - 2 laps of 15 feet with CGA                PT Short Term Goals - 10/13/19 1016  PT SHORT TERM GOAL #1   Title  Patient will be independent in HEP to improve functional outcomes.    Time  4    Period  Weeks    Status  New    Target Date  11/10/19      PT SHORT TERM GOAL #2   Title  Patient will be able to stand on either leg for at least 10 seconds without UE support to demonstrate improved static balance.    Time  4    Period  Weeks    Status  New    Target Date  11/10/19        PT Long Term Goals - 10/13/19 1018      PT LONG TERM GOAL #1   Title  Patient will be able to report at least 25% improvement in overall balance to demonstrate improved confidence in balance.    Time  8    Period  Weeks    Status  New    Target Date  12/08/19      PT LONG TERM GOAL #2   Title  Patient will score at least 20 on DGI to decrease risk of falls.    Time  8    Period  Weeks    Status  New    Target Date  12/08/19      PT LONG TERM GOAL #3   Title  Patient will be able to stand in tandem for at least 15  seconds with either leg posterior to demonstrate improved static balance.    Time  8    Period  Weeks    Status  New    Target Date  12/08/19            Plan - 11/08/19 1013    Clinical Impression Statement  Focused on complex and big movement patterns and balance today. Improvement noted with practice but continues to need min guard assist throughout session to keep from loss of balance. Patient is doing well and was able to ambulate further in 2-minute walk test today. Fatigue noted end of session but improved stride length also noted end of session. Patient would continue to benefit from skilled physical therapy focusing on gait, balance and improving overall gross mobility with functional movements.    Personal Factors and Comorbidities  Age;Comorbidity 1;Comorbidity 2;Comorbidity 3+    Comorbidities  hx of seizures, vagal nerve stimulator, PD, back and neck pain, heart attack    Examination-Activity Limitations  Bend;Continence;Lift;Reach Overhead;Squat;Stand;Transfers    Examination-Participation Restrictions  Community Activity;Yard Work;Cleaning    Stability/Clinical Decision Making  Evolving/Moderate complexity    Rehab Potential  Good    PT Frequency  2x / week    PT Duration  8 weeks    PT Treatment/Interventions  ADLs/Self Care Home Management;Aquatic Therapy;Cryotherapy;Electrical Stimulation;Moist Heat;Therapeutic exercise;Therapeutic activities;Functional mobility training;Stair training;Gait training;Balance training;Neuromuscular re-education;Patient/family education;Manual techniques;Energy conservation;Passive range of motion    PT Next Visit Plan  Progress BLE strength and balance as able.Use belt and min guard assist. Big  movements    PT Home Exercise Plan  11/10 - lateral stepping at rail    Consulted and Agree with Plan of Care  Patient       Patient will benefit from skilled therapeutic intervention in order to improve the following deficits and impairments:   Abnormal gait, Decreased activity tolerance, Decreased balance, Decreased range of motion, Difficulty walking, Decreased strength, Decreased mobility, Decreased endurance, Pain, Impaired vision/preception  Visit Diagnosis: Difficulty in walking,  not elsewhere classified  History of falling     Problem List There are no active problems to display for this patient.  11:04 AM, 11/08/19 Tereasa CoopMichele Derrel Moore, DPT Physical Therapy with [facility]  [phone] office  [facility] [address] [place], [place], [address] Phone: [phone]   Fax:  [phone]  Name: Eric [name] MRN: [id number] Date of Birth: Jan 25, 1956

## 2019-11-10 ENCOUNTER — Other Ambulatory Visit: Payer: Self-pay

## 2019-11-10 ENCOUNTER — Ambulatory Visit (HOSPITAL_COMMUNITY)
Payer: No Typology Code available for payment source | Attending: Student in an Organized Health Care Education/Training Program | Admitting: Physical Therapy

## 2019-11-10 ENCOUNTER — Encounter (HOSPITAL_COMMUNITY): Payer: Self-pay | Admitting: Physical Therapy

## 2019-11-10 DIAGNOSIS — Z9181 History of falling: Secondary | ICD-10-CM | POA: Diagnosis present

## 2019-11-10 DIAGNOSIS — R262 Difficulty in walking, not elsewhere classified: Secondary | ICD-10-CM

## 2019-11-10 NOTE — Therapy (Signed)
[facility] [address] [place], [place], [address] Phone: [phone]   Fax:  [phone]  Physical Therapy Treatment  Patient Details  Name: Eric [name] MRN: [id number] Date of Birth: 1956/04/02 Referring Provider (PT): Leodis Sias   Encounter Date: 11/10/2019  PT End of Session - 11/10/19 0912    Visit Number  9    Number of Visits  16    Date for PT Re-Evaluation  12/04/19    Authorization Type  [facility]. Medicare - follow medicare guidelines    Authorization Time Period  15 visits [facility] by [facility] with verification dates 08/06/2019 to 12/04/2019. POC dates 10/13/19 to 12/08/19    Authorization - Visit Number  9    Authorization - Number of Visits  15    PT Start Time  0910    PT Stop Time  0950    PT Time Calculation (min)  40 min    Equipment Utilized During Treatment  Gait belt    Activity Tolerance  Patient tolerated treatment well    Behavior During Therapy  WFL for tasks assessed/performed       Past Medical History:  Diagnosis Date  . A-fib ([facility])   . Hypercholesteremia   . Seizures ([facility])     Past Surgical History:  Procedure Laterality Date  . APPENDECTOMY    . CARDIAC ELECTROPHYSIOLOGY STUDY AND ABLATION    . IMPLANTATION VAGAL NERVE STIMULATOR  03/2017    There were no vitals filed for this visit.  Subjective Assessment - 11/10/19 0912    Subjective  Patient says he is feeling "ok" today, and reports no new issues.    Limitations  Standing;Walking;House hold activities    How long can you sit comfortably?  no difficulties    How long can you stand comfortably?  depends    How long can you walk comfortably?  a couple minutes    Patient Stated Goals  to have less falls    Currently in Pain?  No/denies                       [facility] Adult PT Treatment/Exercise - 11/10/19 0001      Knee/Hip Exercises: Standing   Heel Raises  Both;20 reps    Hip Abduction  Both;20 reps    Abduction Limitations  green band     Hip Extension  Both;20 reps    Extension Limitations  green band     Other Standing Knee Exercises  trunk turns on blue foam x10 each     Other Standing Knee Exercises  3 way cone taps; 5 x each leg           Balance Exercises - 11/10/19 0946      Balance Exercises: Standing   Standing Eyes Opened  Narrow base of support (BOS);Foam/compliant surface;3 reps;30 secs   with perturbations (therapist)   Standing Eyes Closed  Narrow base of support (BOS);Foam/compliant surface;3 reps;30 secs    Tandem Gait  Forward;2 reps;Foam/compliant surface    Sidestepping  2 reps   green band   Step Over Hurdles / Cones  3 x RT 4 12 inch hurdles           PT Short Term Goals - 10/13/19 1016      PT SHORT TERM GOAL #1   Title  Patient will be independent in HEP to improve functional outcomes.    Time  4  Period  Weeks    Status  New    Target Date  11/10/19      PT SHORT TERM GOAL #2   Title  Patient will be able to stand on either leg for at least 10 seconds without UE support to demonstrate improved static balance.    Time  4    Period  Weeks    Status  New    Target Date  11/10/19        PT Long Term Goals - 10/13/19 1018      PT LONG TERM GOAL #1   Title  Patient will be able to report at least 25% improvement in overall balance to demonstrate improved confidence in balance.    Time  8    Period  Weeks    Status  New    Target Date  12/08/19      PT LONG TERM GOAL #2   Title  Patient will score at least 20 on DGI to decrease risk of falls.    Time  8    Period  Weeks    Status  New    Target Date  12/08/19      PT LONG TERM GOAL #3   Title  Patient will be able to stand in tandem for at least 15 seconds with either leg posterior to demonstrate improved static balance.    Time  8    Period  Weeks    Status  New    Target Date  12/08/19            Plan - 11/10/19 1007    Clinical Impression Statement  Patient demos  improvement with dynamic balance activity today. Patient was progressed to tandem gait on balance beam, but required therapist correct for LOB x 1. Patient showed good balance strategy with added perturbations with narrow stance on foam, but was much less steady with NBOS on foam with eyes closed. Patient required min tactile cueing for balance with eyes closed and had mod sway, but did improve with reps. Progressed to green band with hip strengthening exercise, patient noted increased muscle fatigue in legs post session.    Personal Factors and Comorbidities  Age;Comorbidity 1;Comorbidity 2;Comorbidity 3+    Comorbidities  hx of seizures, vagal nerve stimulator, PD, back and neck pain, heart attack    Examination-Activity Limitations  Bend;Continence;Lift;Reach Overhead;Squat;Stand;Transfers    Examination-Participation Restrictions  Community Activity;Yard Work;Cleaning    Stability/Clinical Decision Making  Evolving/Moderate complexity    Rehab Potential  Good    PT Frequency  2x / week    PT Duration  8 weeks    PT Treatment/Interventions  ADLs/Self Care Home Management;Aquatic Therapy;Cryotherapy;Electrical Stimulation;Moist Heat;Therapeutic exercise;Therapeutic activities;Functional mobility training;Stair training;Gait training;Balance training;Neuromuscular re-education;Patient/family education;Manual techniques;Energy conservation;Passive range of motion    PT Next Visit Plan  Progress dynamaic balance as tolerated. Add gait with head turns, and possibly sidestepping on balance beam next visit.    PT Home Exercise Plan  11/10 - lateral stepping at rail    Consulted and Agree with Plan of Care  Patient       Patient will benefit from skilled therapeutic intervention in order to improve the following deficits and impairments:  Abnormal gait, Decreased activity tolerance, Decreased balance, Decreased range of motion, Difficulty walking, Decreased strength, Decreased mobility, Decreased  endurance, Pain, Impaired vision/preception  Visit Diagnosis: Difficulty in walking, not elsewhere classified  History of falling     Problem List There are no active problems  to display for this patient.  10:17 AM, 11/10/19 Josue Hector PT DPT  Physical Therapist with [facility]  [phone] [place] [address] [place], [place], [address] Phone: [phone]   Fax:  [phone]  Name: DOLORES MCGOVERN MRN: [id number] Date of Birth: Mar 25, 1956

## 2019-11-15 ENCOUNTER — Encounter (HOSPITAL_COMMUNITY): Payer: Self-pay | Admitting: Physical Therapy

## 2019-11-15 ENCOUNTER — Ambulatory Visit (HOSPITAL_COMMUNITY): Payer: No Typology Code available for payment source | Admitting: Physical Therapy

## 2019-11-15 ENCOUNTER — Other Ambulatory Visit: Payer: Self-pay

## 2019-11-15 DIAGNOSIS — R262 Difficulty in walking, not elsewhere classified: Secondary | ICD-10-CM | POA: Diagnosis not present

## 2019-11-15 DIAGNOSIS — Z9181 History of falling: Secondary | ICD-10-CM

## 2019-11-15 NOTE — Therapy (Signed)
[place], [place], [address] Phone: [phone]   Fax:  [phone]  Physical Therapy Treatment/ Progress Note  Patient Details  Name: Eric [name] MRN: [id number] Date of Birth: [birth year] Referring Provider (PT): Jolyne Loa   Encounter Date: 11/15/2019   Progress Note Reporting Period 10/13/19 to 11/15/19  See note below for Objective Data and Assessment of Progress/Goals.     PT End of Session - 11/15/19 1058    Visit Number  10    Number of Visits  16    Date for PT Re-Evaluation  12/04/19   Progress note completed 11/15/19   Authorization Type  [facility]. Medicare - follow medicare guidelines    Authorization Time Period  15 visits approved by [place] with verification dates 08/06/2019 to 12/04/2019. POC dates 10/13/19 to 12/08/19    Authorization - Visit Number  10    Authorization - Number of Visits  15    PT Start Time  2620    PT Stop Time  1125    PT Time Calculation (min)  45 min    Equipment Utilized During Treatment  Gait belt    Activity Tolerance  Patient tolerated treatment well    Behavior During Therapy  WFL for tasks assessed/performed       Past Medical History:  Diagnosis Date  . A-fib ([place])   . Hypercholesteremia   . Seizures ([place])     Past Surgical History:  Procedure Laterality Date  . APPENDECTOMY    . CARDIAC ELECTROPHYSIOLOGY STUDY AND ABLATION    . IMPLANTATION VAGAL NERVE STIMULATOR  03/2017    There were no vitals filed for this visit.  Subjective Assessment - 11/15/19 1057    Subjective  Patient says he is doing much better with his balance since starting therapy. Patient denies having any falls since starting therapy, but notes he is concerned about his balance with getting in and out of his shower. Patient states he has a hand rail but has not had it installed yet.    Limitations  Standing;Walking;House hold activities    How long can you sit  comfortably?  no difficulties    How long can you stand comfortably?  depends    How long can you walk comfortably?  a couple minutes    Patient Stated Goals  to have less falls    Currently in Pain?  No/denies         [facility] PT Assessment - 11/15/19 0001      Assessment   Medical Diagnosis  gait disorder, falls    Referring Provider (PT)  Jolyne Loa      Precautions   Precautions  Fall      Restrictions   Weight Bearing Restrictions  No      Balance Screen   Has the patient fallen in the past 6 months  No    Has the patient had a decrease in activity level because of a fear of falling?   Yes    Is the patient reluctant to leave their home because of a fear of falling?   No      Home Environment   Living Environment  Private residence      Prior Function   Level of Independence  Independent with household mobility with device      Cognition   Overall Cognitive Status  History of cognitive impairments - at baseline  Observation/Other Assessments   Focus on Therapeutic Outcomes (FOTO)   50%   was 49%     Ambulation/Gait   Ambulation/Gait  Yes    Ambulation/Gait Assistance  6: Modified independent (Device/Increase time)    Assistive device  None    Gait Pattern  Decreased stride length;Narrow base of support;Decreased arm swing - right;Decreased arm swing - left    Ambulation Surface  Level;Indoor    Stairs  Yes    Stairs Assistance  6: Modified independent (Device/Increase time)    Stair Management Technique  One rail Left;Alternating pattern   very unsteady with attempt at no rail   Number of Stairs  8    Height of Stairs  7      Static Standing Balance   Static Standing Balance -  Activities   Single Leg Stance - Right Leg;Single Leg Stance - Left Leg;Tandam Stance - Right Leg;Tandam Stance - Left Leg    Static Standing - Comment/# of Minutes  10 sec; 30 sec; 30 sec, 5 sec      Dynamic Gait Index   Level Surface  Normal    Change in Gait Speed   Normal    Gait with Horizontal Head Turns  Moderate Impairment    Gait with Vertical Head Turns  Mild Impairment    Gait and Pivot Turn  Normal    Step Over Obstacle  Normal    Step Around Obstacles  Normal    Steps  Mild Impairment    Total Score  20                        Balance Exercises - 11/15/19 1157      Balance Exercises: Standing   Tandem Stance  Eyes open;2 reps;30 secs    SLS  Eyes open;3 reps;10 secs;Intermittent upper extremity support;Solid surface    Tandem Gait  Forward;2 reps;Foam/compliant surface    Sidestepping  2 reps;Foam/compliant support      OTAGO PROGRAM   Stair Walking  2 x RT; 7 inch; hand rail x1          PT Short Term Goals - 11/15/19 1119      PT SHORT TERM GOAL #1   Title  Patient will be independent in HEP to improve functional outcomes.    Time  4    Period  Weeks    Status  Achieved    Target Date  11/10/19      PT SHORT TERM GOAL #2   Title  Patient will be able to stand on either leg for at least 10 seconds without UE support to demonstrate improved static balance.    Baseline  Able to hold 10 seconds on RT SLS; but 5 sec with mod sway with LT SLS    Time  4    Period  Weeks    Status  On-going    Target Date  11/10/19        PT Long Term Goals - 11/15/19 1059      PT LONG TERM GOAL #1   Title  Patient will be able to report at least 25% improvement in overall balance to demonstrate improved confidence in balance.    Baseline  Current: 50-60%    Time  8    Period  Weeks    Status  Achieved    Target Date  12/08/19      PT LONG TERM GOAL #2   Title  Patient  will score at least 20 on DGI to decrease risk of falls.    Baseline  Current: 20    Time  8    Period  Weeks    Status  Achieved    Target Date  12/08/19      PT LONG TERM GOAL #3   Title  Patient will be able to stand in tandem for at least 15 seconds with either leg posterior to demonstrate improved static balance.    Time  8    Period   Weeks    Status  Achieved    Target Date  12/08/19      PT LONG TERM GOAL #4   Title  Patient will improve FOTO score to <40% to demonstrate improvement in functional mobility and ability to perform ADLs.    Baseline  Current : 50%    Time  3    Period  Weeks    Status  New    Target Date  12/08/19            Plan - 11/15/19 1143    Clinical Impression Statement  Patient is progressing well toward LTGs. Patient has met DGI LTGs, but continues to demo difficulty with dynamic balance activity, especially activity on compliant surface. Patient has improved with static balance, and functional strength tasks, but continues to be limited most by challenges to proprioception and dynamic balance, which are affecting functional mobility and stair ambulation. Patient will continue to benefit from skilled therapy services to address remaining deficits to improve balance, functional mobility and reduce risk for future falls.    Personal Factors and Comorbidities  Age;Comorbidity 1;Comorbidity 2;Comorbidity 3+    Comorbidities  hx of seizures, vagal nerve stimulator, PD, back and neck pain, heart attack    Examination-Activity Limitations  Bend;Continence;Lift;Reach Overhead;Squat;Stand;Transfers    Examination-Participation Restrictions  Community Activity;Yard Work;Cleaning    Stability/Clinical Decision Making  Evolving/Moderate complexity    Rehab Potential  Good    PT Frequency  2x / week    PT Duration  3 weeks    PT Treatment/Interventions  ADLs/Self Care Home Management;Aquatic Therapy;Cryotherapy;Electrical Stimulation;Moist Heat;Therapeutic exercise;Therapeutic activities;Functional mobility training;Stair training;Gait training;Balance training;Neuromuscular re-education;Patient/family education;Manual techniques;Energy conservation;Passive range of motion    PT Next Visit Plan  Continue to progress BLE strenght, and balance with focus on dynamaic and complaint surface. Work on improving  balance and coordination with stair ambulation.    PT Home Exercise Plan  11/10 - lateral stepping at rail    Consulted and Agree with Plan of Care  Patient       Patient will benefit from skilled therapeutic intervention in order to improve the following deficits and impairments:  Abnormal gait, Decreased activity tolerance, Decreased balance, Decreased range of motion, Difficulty walking, Decreased strength, Decreased mobility, Decreased endurance, Pain, Impaired vision/preception  Visit Diagnosis: Difficulty in walking, not elsewhere classified  History of falling     Problem List There are no active problems to display for this patient.  12:04 PM, 11/15/19 Josue Hector PT DPT  Physical Therapist with [facility]  [phone] [place] [address] [place], [place], [address] Phone: [phone]   Fax:  [phone]  Name: Eric [name] MRN: [id number] Date of Birth: 1956/03/21

## 2019-11-17 ENCOUNTER — Encounter (HOSPITAL_COMMUNITY): Payer: Self-pay | Admitting: Physical Therapy

## 2019-11-17 ENCOUNTER — Ambulatory Visit (HOSPITAL_COMMUNITY): Payer: No Typology Code available for payment source | Admitting: Physical Therapy

## 2019-11-17 ENCOUNTER — Other Ambulatory Visit: Payer: Self-pay

## 2019-11-17 DIAGNOSIS — R262 Difficulty in walking, not elsewhere classified: Secondary | ICD-10-CM | POA: Diagnosis not present

## 2019-11-17 DIAGNOSIS — Z9181 History of falling: Secondary | ICD-10-CM

## 2019-11-17 NOTE — Therapy (Signed)
[facility] [address] [place], [place], [address] Phone: [phone]   Fax:  [phone]  Physical Therapy Treatment  Patient Details  Name: Eric [name] MRN: [id number] Date of Birth: 1956/01/08 Referring Provider (PT): Leodis Sias   Encounter Date: 11/17/2019  PT End of Session - 11/17/19 1048    Visit Number  11    Number of Visits  16    Date for PT Re-Evaluation  12/04/19   Progress note completed 11/15/19   Authorization Type  [facility]. Medicare - follow medicare guidelines    Authorization Time Period  15 visits approved by VA with verification dates 08/06/2019 to 12/04/2019. POC dates 10/13/19 to 12/08/19    Authorization - Visit Number  11    Authorization - Number of Visits  15    PT Start Time  1047   Patient arrived late   PT Stop Time  1127    PT Time Calculation (min)  40 min    Equipment Utilized During Treatment  Gait belt    Activity Tolerance  Patient tolerated treatment well    Behavior During Therapy  WFL for tasks assessed/performed       Past Medical History:  Diagnosis Date  . A-fib ([facility])   . Hypercholesteremia   . Seizures ([facility])     Past Surgical History:  Procedure Laterality Date  . APPENDECTOMY    . CARDIAC ELECTROPHYSIOLOGY STUDY AND ABLATION    . IMPLANTATION VAGAL NERVE STIMULATOR  03/2017    There were no vitals filed for this visit.  Subjective Assessment - 11/17/19 1050    Subjective  Patient reports no new issues since last visit. Patient reports no pain currently, no new falls.    Limitations  Standing;Walking;House hold activities    How long can you sit comfortably?  no difficulties    How long can you stand comfortably?  depends    How long can you walk comfortably?  a couple minutes    Patient Stated Goals  to have less falls    Currently in Pain?  No/denies                       [facility] Adult PT Treatment/Exercise - 11/17/19 0001      Knee/Hip  Exercises: Machines for Training and development officer walkouts 40# x10      Knee/Hip Exercises: Standing   Other Standing Knee Exercises  Ladder drills, fwd 2 up 1 back, sidestepping, every other ; 1 RT each    Other Standing Knee Exercises  3 way cone taps; 5 x each leg           Balance Exercises - 11/17/19 1200      Balance Exercises: Standing   Tandem Stance  Eyes open;2 reps;30 secs;Foam/compliant surface    SLS  Eyes open;3 reps;Intermittent upper extremity support;Solid surface;15 secs    Tandem Gait  Forward;2 reps;Foam/compliant surface    Sidestepping  2 reps;Foam/compliant support      OTAGO PROGRAM   Stair Walking  3 RT, both sides, reciprocal gait, no hand rail with CG assist          PT Short Term Goals - 11/15/19 1119      PT SHORT TERM GOAL #1   Title  Patient will be independent in HEP to improve functional outcomes.    Time  4    Period  Weeks    Status  Achieved    Target Date  11/10/19      PT SHORT TERM GOAL #2   Title  Patient will be able to stand on either leg for at least 10 seconds without UE support to demonstrate improved static balance.    Baseline  Able to hold 10 seconds on RT SLS; but 5 sec with mod sway with LT SLS    Time  4    Period  Weeks    Status  On-going    Target Date  11/10/19        PT Long Term Goals - 11/15/19 1059      PT LONG TERM GOAL #1   Title  Patient will be able to report at least 25% improvement in overall balance to demonstrate improved confidence in balance.    Baseline  Current: 50-60%    Time  8    Period  Weeks    Status  Achieved    Target Date  12/08/19      PT LONG TERM GOAL #2   Title  Patient will score at least 20 on DGI to decrease risk of falls.    Baseline  Current: 20    Time  8    Period  Weeks    Status  Achieved    Target Date  12/08/19      PT LONG TERM GOAL #3   Title  Patient will be able to stand in tandem for at least 15 seconds with either leg posterior to  demonstrate improved static balance.    Time  8    Period  Weeks    Status  Achieved    Target Date  12/08/19      PT LONG TERM GOAL #4   Title  Patient will improve FOTO score to <40% to demonstrate improvement in functional mobility and ability to perform ADLs.    Baseline  Current : 50%    Time  3    Period  Weeks    Status  New    Target Date  12/08/19            Plan - 11/17/19 1157    Clinical Impression Statement  Patient tolerates treatment well today. Continued dynamic balance activity. Patient was able to improve balance with sidestepping on balance beam with verbal cueing for maintaining torso in line with hips and ankles. Patient continues to demo dynamic weakness in BLEs, as evidenced by excessive lateral leaning in SLS. Patient had slightly more difficulty with coordinating limb movements of BLEs during 2 way cone taps this morning, but tolerated added warrior I and II pose well with good return. Patient noted improvement in sequencing and stability with stair ambulation after focused stepping during ladder drills.    Personal Factors and Comorbidities  Age;Comorbidity 1;Comorbidity 2;Comorbidity 3+    Comorbidities  hx of seizures, vagal nerve stimulator, PD, back and neck pain, heart attack    Examination-Activity Limitations  Bend;Continence;Lift;Reach Overhead;Squat;Stand;Transfers    Examination-Participation Restrictions  Community Activity;Yard Work;Cleaning    Stability/Clinical Decision Making  Evolving/Moderate complexity    Rehab Potential  Good    PT Frequency  2x / week    PT Duration  3 weeks    PT Treatment/Interventions  ADLs/Self Care Home Management;Aquatic Therapy;Cryotherapy;Electrical Stimulation;Moist Heat;Therapeutic exercise;Therapeutic activities;Functional mobility training;Stair training;Gait training;Balance training;Neuromuscular re-education;Patient/family education;Manual techniques;Energy conservation;Passive range of motion    PT Next  Visit Plan  Continue to progress dynamic balance as tolerated. Continue with ladder drills and  stair ambulation. Add staggered stance pallof press with twist next visit.    PT Home Exercise Plan  11/10 - lateral stepping at rail    Consulted and Agree with Plan of Care  Patient       Patient will benefit from skilled therapeutic intervention in order to improve the following deficits and impairments:  Abnormal gait, Decreased activity tolerance, Decreased balance, Decreased range of motion, Difficulty walking, Decreased strength, Decreased mobility, Decreased endurance, Pain, Impaired vision/preception  Visit Diagnosis: Difficulty in walking, not elsewhere classified  History of falling     Problem List There are no active problems to display for this patient.  12:04 PM, 11/17/19 Georges Lynchameron Baylin Gamblin PT DPT  Physical Therapist with [place]  [facility]  [phone]   [facility] [address] [place], [place], [address] Phone: [phone]   Fax:  [phone]  Name: Eric [name] MRN: [id number] Date of Birth: January 09, 1956

## 2019-11-18 ENCOUNTER — Telehealth (HOSPITAL_COMMUNITY): Payer: Self-pay | Admitting: Physical Therapy

## 2019-11-18 NOTE — Telephone Encounter (Signed)
[facility] L[facility]M REQ RETURN PH CALL - CALLED L/M FOR MOESHA TO CALL ME BACK IF SHE NEEDED SOMETHING. MOESHA WAS NOT AVAILALBE [phone]).

## 2019-11-21 ENCOUNTER — Other Ambulatory Visit: Payer: Self-pay

## 2019-11-21 ENCOUNTER — Encounter (HOSPITAL_COMMUNITY): Payer: Self-pay | Admitting: Emergency Medicine

## 2019-11-21 ENCOUNTER — Emergency Department (HOSPITAL_COMMUNITY)
Admission: EM | Admit: 2019-11-21 | Discharge: 2019-11-21 | Disposition: A | Discharge status: Home / Self Care | Payer: No Typology Code available for payment source | Attending: Emergency Medicine | Admitting: Emergency Medicine

## 2019-11-21 ENCOUNTER — Emergency Department (HOSPITAL_COMMUNITY): Payer: No Typology Code available for payment source

## 2019-11-21 DIAGNOSIS — R0600 Dyspnea, unspecified: Secondary | ICD-10-CM | POA: Diagnosis not present

## 2019-11-21 DIAGNOSIS — R0789 Other chest pain: Secondary | ICD-10-CM | POA: Diagnosis not present

## 2019-11-21 DIAGNOSIS — Z20828 Contact with and (suspected) exposure to other viral communicable diseases: Secondary | ICD-10-CM | POA: Insufficient documentation

## 2019-11-21 DIAGNOSIS — R05 Cough: Secondary | ICD-10-CM | POA: Insufficient documentation

## 2019-11-21 DIAGNOSIS — R430 Anosmia: Secondary | ICD-10-CM | POA: Insufficient documentation

## 2019-11-21 DIAGNOSIS — Z20822 Contact with and (suspected) exposure to covid-19: Secondary | ICD-10-CM

## 2019-11-21 DIAGNOSIS — R509 Fever, unspecified: Secondary | ICD-10-CM | POA: Diagnosis present

## 2019-11-21 LAB — CBC WITH DIFFERENTIAL/PLATELET
Abs Immature Granulocytes: 0.01 10*3/uL (ref 0.00–0.07)
Basophils Absolute: 0 10*3/uL (ref 0.0–0.1)
Basophils Relative: 0 %
Eosinophils Absolute: 0 10*3/uL (ref 0.0–0.5)
Eosinophils Relative: 0 %
HCT: 39.6 % (ref 39.0–52.0)
Hemoglobin: 13.1 g/dL (ref 13.0–17.0)
Immature Granulocytes: 0 %
Lymphocytes Relative: 17 %
Lymphs Abs: 0.9 10*3/uL (ref 0.7–4.0)
MCH: 32.7 pg (ref 26.0–34.0)
MCHC: 33.1 g/dL (ref 30.0–36.0)
MCV: 98.8 fL (ref 80.0–100.0)
Monocytes Absolute: 0.8 10*3/uL (ref 0.1–1.0)
Monocytes Relative: 15 %
Neutro Abs: 3.5 10*3/uL (ref 1.7–7.7)
Neutrophils Relative %: 68 %
Platelets: 173 10*3/uL (ref 150–400)
RBC: 4.01 MIL/uL — ABNORMAL LOW (ref 4.22–5.81)
RDW: 13 % (ref 11.5–15.5)
WBC: 5.2 10*3/uL (ref 4.0–10.5)
nRBC: 0 % (ref 0.0–0.2)

## 2019-11-21 LAB — BASIC METABOLIC PANEL
Anion gap: 13 (ref 5–15)
BUN: 12 mg/dL (ref 8–23)
CO2: 24 mmol/L (ref 22–32)
Calcium: 8.7 mg/dL — ABNORMAL LOW (ref 8.9–10.3)
Chloride: 98 mmol/L (ref 98–111)
Creatinine, Ser: 1.06 mg/dL (ref 0.61–1.24)
GFR calc Af Amer: >60 mL/min (ref >60)
GFR calc non Af Amer: >60 mL/min (ref >60)
Glucose, Bld: 91 mg/dL (ref 70–99)
Potassium: 3.7 mmol/L (ref 3.5–5.1)
Sodium: 135 mmol/L (ref 135–145)

## 2019-11-21 LAB — SARS CORONAVIRUS 2 (TAT 6-24 HRS): SARS Coronavirus 2: NEGATIVE

## 2019-11-21 LAB — TROPONIN I (HIGH SENSITIVITY): Troponin I (High Sensitivity): 6 ng/L (ref <18)

## 2019-11-21 LAB — POC SARS CORONAVIRUS 2 AG -  ED: SARS Coronavirus 2 Ag: NEGATIVE

## 2019-11-21 MED ORDER — ACETAMINOPHEN 325 MG PO TABS: 650.0000 mg | ORAL_TABLET | Freq: Once | ORAL | Status: DC

## 2019-11-21 MED ORDER — DOXYCYCLINE HYCLATE 100 MG PO CAPS: 100.0000 mg | ORAL_CAPSULE | Freq: Two times a day (BID) | ORAL | 0 refills | Status: AC

## 2019-11-21 NOTE — ED Notes (Signed)
Discharge instructions reviewed with pt's wife, Nole Robey, over the phone due to no visitor restrictions because of pt being covid positive. All questions were answered. Teach back method used. Instructions were given on diagnosis, medication, isolation, and reasons to return to ED.  Pt taken out to his car via wheelchair.

## 2019-11-21 NOTE — Discharge Instructions (Signed)
You were seen in the emergency department today with COVID-19-like illness.  Your rapid test was negative but I am sending the more accurate test which will come back in the next 24 hours.  You can follow the results in [place].  Please stay isolated and away from others.  Please wear a mask.  I am starting you on antibiotic but if your COVID-19 test comes back positive you can stop taking this.  If he develop worsening shortness of breath, chest pain, other severe symptoms you should return to the hospital.

## 2019-11-21 NOTE — ED Triage Notes (Signed)
Pt reports fever, SHOB, cough, loss of taste/smell x 2 days.

## 2019-11-21 NOTE — ED Provider Notes (Signed)
Emergency Department Provider Note   I have reviewed the triage vital signs and the nursing notes.   HISTORY  Chief Complaint Fever and Shortness of Breath   HPI Eric [name] is a 63 y.o. male with PMH of A-fib, HLD, and seizure presents to the ED for evaluation of fever, cough, exertional dyspnea, and anosmia.  Symptoms have been going on for 2 to 3 days.  Patient denies any chest pain but in talking with his wife by phone she states that he has been complaining of some chest discomfort over the past 1 to 2 days.  No known COVID-19 contacts.  No diarrhea or vomiting.  Patient denies feeling short of breath at rest.  No radiation of symptoms or other modifying factors.  Past Medical History:  Diagnosis Date  . A-fib ([place])   . Hypercholesteremia   . Seizures ([place])     There are no problems to display for this patient.   Past Surgical History:  Procedure Laterality Date  . APPENDECTOMY    . CARDIAC ELECTROPHYSIOLOGY STUDY AND ABLATION    . IMPLANTATION VAGAL NERVE STIMULATOR  03/2017    Allergies Patient has no known allergies.  Family History  Problem Relation Age of Onset  . Heart disease Mother        pacemaker in his 25s  . Heart failure Father   . Stroke Father   . Heart attack Brother        3s    Social History Social History   Tobacco Use  . Smoking status: Never Smoker  . Smokeless tobacco: Never Used  Substance Use Topics  . Alcohol use: No  . Drug use: No    Review of Systems  Constitutional: Positive fever/chills Eyes: No visual changes. ENT: No sore throat. Cardiovascular: Positive chest pain. Respiratory: Positive shortness of breath with exertion.  Gastrointestinal: No abdominal pain.  No nausea, no vomiting.  No diarrhea.  No constipation. Genitourinary: Negative for dysuria. Musculoskeletal: Negative for back pain. Skin: Negative for rash. Neurological: Negative for headaches, focal weakness or numbness.  10-point ROS  otherwise negative.  ____________________________________________   PHYSICAL EXAM:  VITAL SIGNS: ED Triage Vitals  Enc Vitals Group     BP 11/21/19 1455 127/89     Pulse Rate 11/21/19 1455 83     Resp 11/21/19 1455 18     Temp 11/21/19 1455 98.9 F (37.2 C)     Temp Source 11/21/19 1455 Oral     SpO2 11/21/19 1455 99 %     Weight 11/21/19 1456 160 lb (72.6 kg)     Height 11/21/19 1456 5\' 11"  (1.803 m)   Constitutional: Alert and oriented. Well appearing and in no acute distress. Eyes: Conjunctivae are normal.  Head: Atraumatic. Nose: No congestion/rhinnorhea. Mouth/Throat: Mucous membranes are moist.   Neck: No stridor.   Cardiovascular: Normal rate, regular rhythm. Good peripheral circulation. Grossly normal heart sounds.   Respiratory: Normal respiratory effort.  No retractions. Lungs CTAB. Gastrointestinal: Soft and nontender. No distention.  Musculoskeletal: No gross deformities of extremities. Neurologic:  Normal speech and language. No gross focal neurologic deficits are appreciated.  Skin:  Skin is warm, dry and intact. No rash noted.   ____________________________________________   LABS (all labs ordered are listed, but only abnormal results are displayed)  Labs Reviewed  BASIC METABOLIC PANEL - Abnormal; Notable for the following components:      Result Value   Calcium 8.7 (*)    All other components within  normal limits  CBC WITH DIFFERENTIAL/PLATELET - Abnormal; Notable for the following components:   RBC 4.01 (*)    All other components within normal limits  SARS CORONAVIRUS 2 (TAT 6-24 HRS)  POC SARS CORONAVIRUS 2 AG -  ED  TROPONIN I (HIGH SENSITIVITY)   ____________________________________________  EKG   EKG Interpretation  Date/Time:  Sunday November 21 2019 15:10:25 EST Ventricular Rate:  69 PR Interval:    QRS Duration: 91 QT Interval:  352 QTC Calculation: 377 R Axis:   25 Text Interpretation: Sinus rhythm Abnormal R-wave progression,  early transition No STEMI Confirmed by Alona Bene [phone]) on 11/21/2019 4:37:32 PM       ____________________________________________  RADIOLOGY  DG Chest Portable 1 View  Result Date: 11/21/2019 CLINICAL DATA:  Fever, cough shortness of breath and loss of taste and smell for 2 days. EXAM: PORTABLE CHEST 1 VIEW COMPARISON:  07/15/2019 FINDINGS: Hazy airspace opacity is noted in the right mid and lower lung, new since prior exam. Left lung is clear. No pleural effusion or pneumothorax. Cardiac silhouette is normal in size. No mediastinal or hilar masses. Stimulator device projects over the left mid chest, unchanged. Skeletal structures are grossly intact. IMPRESSION: 1. Right mid to lower lung zone airspace opacities consistent with multifocal pneumonia and suspicious for COVID-19 pneumonia given the patient's history. Electronically Signed   By: Amie Portland M.D.   On: 11/21/2019 16:02    ____________________________________________   PROCEDURES  Procedure(s) performed:   Procedures  None  ____________________________________________   INITIAL IMPRESSION / ASSESSMENT AND PLAN / ED COURSE  Pertinent labs & imaging results that were available during my care of the patient were reviewed by me and considered in my medical decision making (see chart for details).   Patient presents to the emergency department for evaluation of Covid-like symptoms.  Mild fever here without hypoxemia.  With the nurse, the patient started at bedside and march in place with no desaturation or shortness of breath symptoms.  Plan for chest x-ray and screening labs including troponin.  Patient's chest x-ray shows COVID-19 type pneumonia pattern.  No leukocytosis.  Plan to send the PCR confirmatory test.  Will cover with doxycycline as an outpatient but my suspicion remains very high for COVID-19.  He will isolate and follow the test results in MyChart.  Discussed strict return precautions with both the  patient and wife by phone. Labs reviewed and unremarkable.  ____________________________________________  FINAL CLINICAL IMPRESSION(S) / ED DIAGNOSES  Final diagnoses:  Suspected COVID-19 virus infection    NEW OUTPATIENT MEDICATIONS STARTED DURING THIS VISIT:  New Prescriptions   DOXYCYCLINE (VIBRAMYCIN) 100 MG CAPSULE    Take 1 capsule (100 mg total) by mouth 2 (two) times daily for 7 days.    Note:  This document was prepared using Dragon voice recognition software and may include unintentional dictation errors.  Alona Bene, MD, [facility] Emergency Medicine    Rissie Sculley, Arlyss Repress, MD 11/21/19 1710

## 2019-11-22 ENCOUNTER — Telehealth (HOSPITAL_COMMUNITY): Payer: Self-pay | Admitting: Physical Therapy

## 2019-11-22 ENCOUNTER — Ambulatory Visit (HOSPITAL_COMMUNITY): Payer: No Typology Code available for payment source | Admitting: Physical Therapy

## 2019-11-22 NOTE — Telephone Encounter (Signed)
pt's wife called to cancel this appt since the pt was in the hospital and had a rapid covid test but the doctor stated he should stay home and he was given another test that can take anywhere up to 24 hrs for the results. pt's wife will let us know.

## 2019-11-23 ENCOUNTER — Telehealth (HOSPITAL_COMMUNITY): Payer: Self-pay | Admitting: Physical Therapy

## 2019-11-23 NOTE — Telephone Encounter (Signed)
the pt called to cx this appt due to covid restrictions

## 2019-11-24 ENCOUNTER — Encounter (HOSPITAL_COMMUNITY): Payer: No Typology Code available for payment source | Admitting: Physical Therapy

## 2019-11-29 ENCOUNTER — Ambulatory Visit (HOSPITAL_COMMUNITY): Payer: No Typology Code available for payment source | Admitting: Physical Therapy

## 2019-11-29 ENCOUNTER — Telehealth (HOSPITAL_COMMUNITY): Payer: Self-pay | Admitting: Physical Therapy

## 2019-11-29 NOTE — Telephone Encounter (Signed)
pt called to cancel today's to state that he is not feeling well.

## 2019-11-29 NOTE — Telephone Encounter (Signed)
pt called to cancel today's to state that he is not feeling well. 

## 2019-12-01 ENCOUNTER — Ambulatory Visit (HOSPITAL_COMMUNITY): Payer: No Typology Code available for payment source | Admitting: Physical Therapy

## 2019-12-01 ENCOUNTER — Telehealth (HOSPITAL_COMMUNITY): Payer: Self-pay | Admitting: Physical Therapy

## 2019-12-01 NOTE — Telephone Encounter (Signed)
Called patient about today's missed appointment. Number on record appears to be invalid, received error message.   6:48 PM, 12/01/19 Josue Hector PT DPT  Physical Therapist with [facility]  [phone]

## 2020-01-24 ENCOUNTER — Encounter (HOSPITAL_COMMUNITY): Payer: Self-pay | Admitting: Physical Therapy

## 2020-01-24 NOTE — Therapy (Signed)
[place], [place], [address] Phone: [phone]   Fax:  [phone]  Patient Details  Name: Eric [name] MRN: [id number] Date of Birth: 1956/02/14 Referring Provider:  No ref. provider found  Encounter Date: 01/24/2020   PHYSICAL THERAPY DISCHARGE SUMMARY  Visits from Start of Care: 11  Current functional level related to goals / functional outcomes: Unable to reassess as patient did not return for follow up visits   Remaining deficits: Unable to reassess patient   Education / Equipment: Patient had made moderate progress toward therapy goals, but did not return for follow up visit. Last visit dated 11/17/19. Patient being DC from therapy per non compliance.  Plan: Patient agrees to discharge.  Patient goals were partially met. Patient is being discharged due to not returning since the last visit.  ?????        1:42 PM, 01/24/20 Josue Hector PT DPT  Physical Therapist with [facility]  [phone] [place] [address] [place], [place], [address] Phone: [phone]   Fax:  [phone]

## 2020-02-16 ENCOUNTER — Encounter (HOSPITAL_COMMUNITY): Payer: Self-pay

## 2020-02-16 ENCOUNTER — Emergency Department (HOSPITAL_COMMUNITY): Payer: Medicare Other

## 2020-02-16 ENCOUNTER — Other Ambulatory Visit: Payer: Self-pay

## 2020-02-16 ENCOUNTER — Inpatient Hospital Stay (HOSPITAL_COMMUNITY)
Admission: EM | Admit: 2020-02-16 | Discharge: 2020-02-18 | DRG: 101 | Disposition: A | Discharge status: Home Health | Payer: Medicare Other | Attending: Internal Medicine | Admitting: Internal Medicine

## 2020-02-16 DIAGNOSIS — G40919 Epilepsy, unspecified, intractable, without status epilepticus: Principal | ICD-10-CM | POA: Diagnosis present

## 2020-02-16 DIAGNOSIS — Z8249 Family history of ischemic heart disease and other diseases of the circulatory system: Secondary | ICD-10-CM

## 2020-02-16 DIAGNOSIS — W1839XA Other fall on same level, initial encounter: Secondary | ICD-10-CM | POA: Diagnosis present

## 2020-02-16 DIAGNOSIS — Z23 Encounter for immunization: Secondary | ICD-10-CM

## 2020-02-16 DIAGNOSIS — G9340 Encephalopathy, unspecified: Secondary | ICD-10-CM | POA: Diagnosis present

## 2020-02-16 DIAGNOSIS — R251 Tremor, unspecified: Secondary | ICD-10-CM | POA: Diagnosis present

## 2020-02-16 DIAGNOSIS — R001 Bradycardia, unspecified: Secondary | ICD-10-CM | POA: Diagnosis present

## 2020-02-16 DIAGNOSIS — Z20822 Contact with and (suspected) exposure to covid-19: Secondary | ICD-10-CM | POA: Diagnosis present

## 2020-02-16 DIAGNOSIS — Z823 Family history of stroke: Secondary | ICD-10-CM

## 2020-02-16 DIAGNOSIS — I700 Atherosclerosis of aorta: Secondary | ICD-10-CM | POA: Diagnosis present

## 2020-02-16 DIAGNOSIS — Z79899 Other long term (current) drug therapy: Secondary | ICD-10-CM

## 2020-02-16 DIAGNOSIS — R569 Unspecified convulsions: Secondary | ICD-10-CM

## 2020-02-16 DIAGNOSIS — S2249XA Multiple fractures of ribs, unspecified side, initial encounter for closed fracture: Secondary | ICD-10-CM

## 2020-02-16 DIAGNOSIS — E785 Hyperlipidemia, unspecified: Secondary | ICD-10-CM | POA: Diagnosis present

## 2020-02-16 DIAGNOSIS — I4891 Unspecified atrial fibrillation: Secondary | ICD-10-CM

## 2020-02-16 DIAGNOSIS — J439 Emphysema, unspecified: Secondary | ICD-10-CM | POA: Diagnosis not present

## 2020-02-16 DIAGNOSIS — S2241XA Multiple fractures of ribs, right side, initial encounter for closed fracture: Secondary | ICD-10-CM | POA: Diagnosis not present

## 2020-02-16 DIAGNOSIS — Y92512 Supermarket, store or market as the place of occurrence of the external cause: Secondary | ICD-10-CM

## 2020-02-16 DIAGNOSIS — E7800 Pure hypercholesterolemia, unspecified: Secondary | ICD-10-CM | POA: Diagnosis present

## 2020-02-16 DIAGNOSIS — S0990XA Unspecified injury of head, initial encounter: Secondary | ICD-10-CM | POA: Diagnosis present

## 2020-02-16 LAB — CBC WITH DIFFERENTIAL/PLATELET
Abs Immature Granulocytes: 0.04 10*3/uL (ref 0.00–0.07)
Basophils Absolute: 0 10*3/uL (ref 0.0–0.1)
Basophils Relative: 0 %
Eosinophils Absolute: 0 10*3/uL (ref 0.0–0.5)
Eosinophils Relative: 0 %
HCT: 38 % — ABNORMAL LOW (ref 39.0–52.0)
Hemoglobin: 12.1 g/dL — ABNORMAL LOW (ref 13.0–17.0)
Immature Granulocytes: 1 %
Lymphocytes Relative: 16 %
Lymphs Abs: 1.3 10*3/uL (ref 0.7–4.0)
MCH: 33.4 pg (ref 26.0–34.0)
MCHC: 31.8 g/dL (ref 30.0–36.0)
MCV: 105 fL — ABNORMAL HIGH (ref 80.0–100.0)
Monocytes Absolute: 0.9 10*3/uL (ref 0.1–1.0)
Monocytes Relative: 12 %
Neutro Abs: 5.6 10*3/uL (ref 1.7–7.7)
Neutrophils Relative %: 71 %
Platelets: 167 10*3/uL (ref 150–400)
RBC: 3.62 MIL/uL — ABNORMAL LOW (ref 4.22–5.81)
RDW: 15 % (ref 11.5–15.5)
WBC: 7.8 10*3/uL (ref 4.0–10.5)
nRBC: 0 % (ref 0.0–0.2)

## 2020-02-16 LAB — TSH: TSH: 0.919 u[IU]/mL (ref 0.350–4.500)

## 2020-02-16 LAB — BASIC METABOLIC PANEL
Anion gap: 9 (ref 5–15)
BUN: 21 mg/dL (ref 8–23)
CO2: 28 mmol/L (ref 22–32)
Calcium: 8.9 mg/dL (ref 8.9–10.3)
Chloride: 103 mmol/L (ref 98–111)
Creatinine, Ser: 0.86 mg/dL (ref 0.61–1.24)
GFR calc Af Amer: >60 mL/min (ref >60)
GFR calc non Af Amer: >60 mL/min (ref >60)
Glucose, Bld: 101 mg/dL — ABNORMAL HIGH (ref 70–99)
Potassium: 4.4 mmol/L (ref 3.5–5.1)
Sodium: 140 mmol/L (ref 135–145)

## 2020-02-16 LAB — VALPROIC ACID LEVEL: Valproic Acid Lvl: 55 ug/mL (ref 50.0–100.0)

## 2020-02-16 LAB — CBG MONITORING, ED: Glucose-Capillary: 115 mg/dL — ABNORMAL HIGH (ref 70–99)

## 2020-02-16 LAB — MAGNESIUM: Magnesium: 2.1 mg/dL (ref 1.7–2.4)

## 2020-02-16 LAB — CARBAMAZEPINE LEVEL, TOTAL: Carbamazepine Lvl: 3.3 ug/mL — ABNORMAL LOW (ref 4.0–12.0)

## 2020-02-16 LAB — PHOSPHORUS: Phosphorus: 3.5 mg/dL (ref 2.5–4.6)

## 2020-02-16 MED ORDER — ACETAMINOPHEN 650 MG RE SUPP: 650.0000 mg | Freq: Four times a day (QID) | RECTAL | Status: DC | PRN

## 2020-02-16 MED ORDER — FENTANYL CITRATE (PF) 100 MCG/2ML IJ SOLN
50.0000 ug | Freq: Once | INTRAMUSCULAR | Status: AC
  Administered 2020-02-16: 50 ug via INTRAVENOUS
  Filled 2020-02-16: qty 2

## 2020-02-16 MED ORDER — CARBIDOPA-LEVODOPA 25-100 MG PO TABS
2.0000 | ORAL_TABLET | Freq: Three times a day (TID) | ORAL | Status: DC
  Administered 2020-02-16 – 2020-02-18 (×5): 2 via ORAL
  Filled 2020-02-16 (×5): qty 2

## 2020-02-16 MED ORDER — TETANUS-DIPHTH-ACELL PERTUSSIS 5-2.5-18.5 LF-MCG/0.5 IM SUSP
0.5000 mL | Freq: Once | INTRAMUSCULAR | Status: AC
  Administered 2020-02-16: 0.5 mL via INTRAMUSCULAR
  Filled 2020-02-16: qty 0.5

## 2020-02-16 MED ORDER — CARBAMAZEPINE 200 MG PO TABS
200.0000 mg | ORAL_TABLET | Freq: Three times a day (TID) | ORAL | Status: DC
  Administered 2020-02-16 – 2020-02-18 (×5): 200 mg via ORAL
  Filled 2020-02-16 (×5): qty 1

## 2020-02-16 MED ORDER — HYDROCODONE-ACETAMINOPHEN 5-325 MG PO TABS
1.0000 | ORAL_TABLET | ORAL | Status: DC | PRN
  Administered 2020-02-17 (×2): 1 via ORAL
  Administered 2020-02-18: 2 via ORAL
  Filled 2020-02-16: qty 1
  Filled 2020-02-16: qty 2
  Filled 2020-02-16: qty 1

## 2020-02-16 MED ORDER — FOLIC ACID 1 MG PO TABS
2.0000 mg | ORAL_TABLET | Freq: Every day | ORAL | Status: DC
  Administered 2020-02-17 – 2020-02-18 (×2): 2 mg via ORAL
  Filled 2020-02-16 (×2): qty 2

## 2020-02-16 MED ORDER — IOHEXOL 300 MG/ML  SOLN
100.0000 mL | Freq: Once | INTRAMUSCULAR | Status: AC | PRN
  Administered 2020-02-16: 100 mL via INTRAVENOUS

## 2020-02-16 MED ORDER — OXYCODONE-ACETAMINOPHEN 5-325 MG PO TABS
1.0000 | ORAL_TABLET | Freq: Once | ORAL | Status: AC
  Administered 2020-02-16: 18:00:00 1 via ORAL
  Filled 2020-02-16: qty 1

## 2020-02-16 MED ORDER — POLYETHYLENE GLYCOL 3350 17 G PO PACK: 17.0000 g | PACK | Freq: Every day | ORAL | Status: DC | PRN

## 2020-02-16 MED ORDER — VITAMIN B-12 1000 MCG PO TABS
1000.0000 ug | ORAL_TABLET | Freq: Every day | ORAL | Status: DC
  Administered 2020-02-17 – 2020-02-18 (×2): 1000 ug via ORAL
  Filled 2020-02-16 (×2): qty 1

## 2020-02-16 MED ORDER — ONDANSETRON HCL 4 MG PO TABS: 4.0000 mg | ORAL_TABLET | Freq: Four times a day (QID) | ORAL | Status: DC | PRN

## 2020-02-16 MED ORDER — ONDANSETRON HCL 4 MG/2ML IJ SOLN: 4.0000 mg | Freq: Four times a day (QID) | INTRAMUSCULAR | Status: DC | PRN

## 2020-02-16 MED ORDER — PRIMIDONE 50 MG PO TABS
250.0000 mg | ORAL_TABLET | Freq: Two times a day (BID) | ORAL | Status: DC
  Administered 2020-02-16 – 2020-02-18 (×4): 250 mg via ORAL
  Filled 2020-02-16 (×5): qty 5

## 2020-02-16 MED ORDER — DIVALPROEX SODIUM 250 MG PO DR TAB
750.0000 mg | DELAYED_RELEASE_TABLET | Freq: Two times a day (BID) | ORAL | Status: DC
  Administered 2020-02-16 – 2020-02-18 (×4): 750 mg via ORAL
  Filled 2020-02-16 (×4): qty 3

## 2020-02-16 MED ORDER — ACETAMINOPHEN 325 MG PO TABS: 650.0000 mg | ORAL_TABLET | Freq: Four times a day (QID) | ORAL | Status: DC | PRN

## 2020-02-16 MED ORDER — SIMVASTATIN 20 MG PO TABS
20.0000 mg | ORAL_TABLET | Freq: Every day | ORAL | Status: DC
  Administered 2020-02-16 – 2020-02-17 (×2): 20 mg via ORAL
  Filled 2020-02-16 (×2): qty 1

## 2020-02-16 MED ORDER — THIAMINE HCL 100 MG PO TABS
100.0000 mg | ORAL_TABLET | Freq: Every day | ORAL | Status: DC
  Administered 2020-02-17 – 2020-02-18 (×2): 100 mg via ORAL
  Filled 2020-02-16 (×2): qty 1

## 2020-02-16 MED ORDER — ONDANSETRON HCL 4 MG PO TABS
4.0000 mg | ORAL_TABLET | Freq: Once | ORAL | Status: AC
  Administered 2020-02-16: 4 mg via ORAL
  Filled 2020-02-16: qty 1

## 2020-02-16 NOTE — H&P (Signed)
History and Physical    Eric [name] LNL:[id number] DOB: 02-Jul-1956 DOA: 02/16/2020  PCP: System, Pcp Not In   Patient coming from: Home  I have personally briefly reviewed patient's old medical records in [facility]  Chief Complaint: Seizure, fall  HPI: Eric [name] is a 64 y.o. male with medical history significant for  Seizures, Atria fibrillation.  Patient presented to the ED with complaints of seizure episode and subsequent fall.  Patient was asked to get today when he had a seizure that was witnessed by his wife.  He fell into a clothing rack hitting the right side of his body.  He tells me the seizure lasted about 2 minutes. He is unable to give me further details about his medications. Patient reports that he missed a dose of his seizure medications about 2 days ago- " the new medication" ,  but he is not sure what the name of it is.  He is also unable to tell me if he has been having frequent seizures and when his last seizure episode was. He denies chest pain, difficulty breathing or dizziness prior to episode today.  Unfortunately I am also unable to reach patient's wife, Almon Whitford (Got voice mail).  ED Course: Bradycardic heart rates 45-64, O2 sats 100% on room air.  Unremarkable CBC BMP.  Report acid level 55, carbamazepine level 3.3.  Chest x-ray chest CT without contrast-nondisplaced anterior right seventh eighth and ninth rib fractures.,  Right elbow x-ray and CT abdomen and pelvis with contrast.  Hospitalist admit for seizures, fall and rib fracture requiring adequate pain control.  Review of Systems: As per HPI all other systems reviewed and negative.  Past Medical History:  Diagnosis Date  . A-fib (HCC)   . Hypercholesteremia   . Seizures (HCC)     Past Surgical History:  Procedure Laterality Date  . APPENDECTOMY    . CARDIAC ELECTROPHYSIOLOGY STUDY AND ABLATION    . IMPLANTATION VAGAL NERVE STIMULATOR  03/2017     reports that he has never smoked.  He has never used smokeless tobacco. He reports that he does not drink alcohol or use drugs.  No Known Allergies  Family History  Problem Relation Age of Onset  . Heart disease Mother        pacemaker in his 24s  . Heart failure Father   . Stroke Father   . Heart attack Brother        40s    Prior to Admission medications   Medication Sig Start Date End Date Taking? Authorizing Provider  carbamazepine (TEGRETOL) 200 MG tablet Take 200 mg by mouth 3 (three) times daily.    Yes [provider]  carbidopa-levodopa (SINEMET IR) 25-100 MG tablet Take 2 tablets by mouth 3 (three) times daily.    Yes [provider]  divalproex (DEPAKOTE) 250 MG DR tablet Take 750 mg by mouth 2 (two) times daily.   Yes [provider]  folic acid (FOLVITE) 1 MG tablet Take 2 mg by mouth daily.    Yes [provider]  primidone (MYSOLINE) 250 MG tablet Take 250 mg by mouth 2 (two) times daily.   Yes [provider]  simvastatin (ZOCOR) 20 MG tablet Take 20 mg by mouth at bedtime.    Yes [provider]  thiamine 100 MG tablet Take 100 mg by mouth daily.   Yes [provider]  vitamin B-12 (CYANOCOBALAMIN) 1000 MCG tablet Take 1,000 mcg by mouth daily.  Yes [provider]    Physical Exam: Vitals:   02/16/20 1915 02/16/20 1930 02/16/20 2000 02/16/20 2020  BP:  110/65 122/67   Pulse: (!) 48 (!) 49  (!) 45  Resp: 12 12 (!) 21 14  Temp:      TempSrc:      SpO2: 100% 100%  100%  Weight:      Height:        Constitutional: NAD, calm, comfortable Vitals:   02/16/20 1915 02/16/20 1930 02/16/20 2000 02/16/20 2020  BP:  110/65 122/67   Pulse: (!) 48 (!) 49  (!) 45  Resp: 12 12 (!) 21 14  Temp:      TempSrc:      SpO2: 100% 100%  100%  Weight:      Height:       Eyes: PERRL, lids and conjunctivae normal ENMT: Mucous membranes are moist.  Neck: normal, supple, no masses, no thyromegaly Respiratory: Normal respiratory effort.  No accessory muscle use.  Cardiovascular: Irregular rate and rhythm, no murmurs / rubs / gallops. No extremity edema. 2+ pedal pulses. Abdomen: no tenderness, no masses palpated. No hepatosplenomegaly. Bowel sounds positive.  Musculoskeletal: no clubbing / cyanosis. No joint deformity upper and lower extremities. Good ROM, no contractures. Normal muscle tone.  Skin: no rashes, lesions, ulcers. No induration Neurologic: No gross cranial nerve abnormality, 5/5 strength in all extremities. Psychiatric: Normal judgment and insight. Alert and oriented x 3. Normal mood.   Labs on Admission: I have personally reviewed following labs and imaging studies  CBC: Recent Labs  Lab 02/16/20 1751  WBC 7.8  NEUTROABS 5.6  HGB 12.1*  HCT 38.0*  MCV 105.0*  PLT 167   Basic Metabolic Panel: Recent Labs  Lab 02/16/20 1751  NA 140  K 4.4  CL 103  CO2 28  GLUCOSE 101*  BUN 21  CREATININE 0.86  CALCIUM 8.9   CBG: Recent Labs  Lab 02/16/20 1749  GLUCAP 115*    Radiological Exams on Admission: DG Ribs Unilateral W/Chest Right  Result Date: 02/16/2020 CLINICAL DATA:  Fall into clothes rack at [employer] injuring right side. EXAM: RIGHT RIBS AND CHEST - 3+ VIEW COMPARISON:  Chest radiograph 11/21/2019, CT 05/28/2019 FINDINGS: Nondisplaced fractures of anterior right seventh, eighth, and ninth ribs. There is no evidence of pneumothorax or pleural effusion. Both lungs are clear. Heart size and mediastinal contours are within normal limits. Vagal nerve stimulator on the left. IMPRESSION: Nondisplaced anterior right seventh, eighth, and ninth rib fractures. No pulmonary complication such as pneumothorax. Electronically Signed   By: Narda Rutherford M.D.   On: 02/16/2020 16:40   DG Elbow Complete Right  Result Date: 02/16/2020 CLINICAL DATA:  Recent fall with elbow pain, initial encounter EXAM: RIGHT ELBOW - COMPLETE 3+ VIEW COMPARISON:  None. FINDINGS: There is no evidence of fracture, dislocation, or  joint effusion. There is no evidence of arthropathy or other focal bone abnormality. Soft tissues are unremarkable. IMPRESSION: No acute abnormality noted. Electronically Signed   By: Alcide Clever M.D.   On: 02/16/2020 18:51   CT Head Wo Contrast  Result Date: 02/16/2020 CLINICAL DATA:  Posttraumatic headache. Fall at [employer] striking head on clothes rack. EXAM: CT HEAD WITHOUT CONTRAST TECHNIQUE: Contiguous axial images were obtained from the base of the skull through the vertex without intravenous contrast. COMPARISON:  Head CT 04/09/2018 FINDINGS: Brain: No intracranial hemorrhage, mass effect, or midline shift. Stable degree of atrophy that is mildly advanced for age. Mild chronic  small vessel ischemia. No hydrocephalus. The basilar cisterns are patent. No evidence of territorial infarct or acute ischemia. No extra-axial or intracranial fluid collection. Vascular: Atherosclerosis of skullbase vasculature without hyperdense vessel or abnormal calcification. Skull: No fracture or focal lesion. Sinuses/Orbits: No acute findings. Chronic mucous retention cyst/mucosal thickening in left side of sphenoid sinus. Other: None. IMPRESSION: 1. No acute intracranial abnormality. No skull fracture. 2. Stable atrophy and chronic small vessel ischemia. Electronically Signed   By: Narda Rutherford M.D.   On: 02/16/2020 18:48   CT Chest Wo Contrast  Result Date: 02/16/2020 CLINICAL DATA:  Recent fall with right-sided chest pain and rib fractures on recent plain film EXAM: CT CHEST WITHOUT CONTRAST TECHNIQUE: Multidetector CT imaging of the chest was performed following the standard protocol without IV contrast. COMPARISON:  Plain films from earlier in the same day, CT from 05/28/2019. FINDINGS: Cardiovascular: Somewhat limited due to lack of IV contrast. Atherosclerotic calcifications are seen. No significant aneurysmal dilatation is noted. Coronary calcifications are seen. No cardiac enlargement is noted. The pulmonary  artery shows a normal size. Mediastinum/Nodes: Thoracic inlet is within normal limits. No hilar or mediastinal adenopathy is noted. No mediastinal hematoma is seen. Lungs/Pleura: Scattered calcified granulomas are noted within the lungs. The lungs are well aerated without focal infiltrate or sizable effusion. No pneumothorax is seen. Minimal emphysematous changes are noted. Upper Abdomen: Visualized upper abdomen is within normal limits. Musculoskeletal: No compression deformities are seen. No sternal fracture is noted. Mildly displaced fractures of the sixth through ninth ribs on the right are noted similar to that seen on prior plain film examination. No left-sided rib fractures are seen. IMPRESSION: Multiple right-sided rib fractures without complicating factors. Changes of prior granulomatous disease. Aortic Atherosclerosis (ICD10-I70.0) and Emphysema (ICD10-J43.9). Electronically Signed   By: Alcide Clever M.D.   On: 02/16/2020 18:50   CT ABDOMEN PELVIS W CONTRAST  Result Date: 02/16/2020 CLINICAL DATA:  Acute pain due to trauma. EXAM: CT ABDOMEN AND PELVIS WITH CONTRAST TECHNIQUE: Multidetector CT imaging of the abdomen and pelvis was performed using the standard protocol following bolus administration of intravenous contrast. CONTRAST:  OMNIPAQUE IOHEXOL 300 MG/ML  SOLN COMPARISON:  None. FINDINGS: Lower chest: The lung bases are clear. The heart size is normal. Hepatobiliary: The liver is normal. Normal gallbladder.There is no biliary ductal dilation. Pancreas: Normal contours without ductal dilatation. No peripancreatic fluid collection. Spleen: No splenic laceration or hematoma. Adrenals/Urinary Tract: --Adrenal glands: No adrenal hemorrhage. --Right kidney/ureter: No hydronephrosis or perinephric hematoma. --Left kidney/ureter: No hydronephrosis or perinephric hematoma. --Urinary bladder: The bladder wall is thickened which is felt to be secondary to underdistention. Stomach/Bowel:  --Stomach/Duodenum: No hiatal hernia or other gastric abnormality. Normal duodenal course and caliber. --Small bowel: No dilatation or inflammation. --Colon: No focal abnormality. --Appendix: Normal. Vascular/Lymphatic: Atherosclerotic calcification is present within the non-aneurysmal abdominal aorta, without hemodynamically significant stenosis. --No retroperitoneal lymphadenopathy. --No mesenteric lymphadenopathy. --No pelvic or inguinal lymphadenopathy. Reproductive: Unremarkable Other: There is a trace amount of free fluid in the patient's pelvis. There is a fat containing umbilical hernia. Musculoskeletal. Acute right-sided rib fractures are noted, better visualized on prior chest CT. There are old healed left-sided rib fractures. There is a subcutaneous contusion overlying the proximal right femur. No large hematoma. IMPRESSION: 1. No acute traumatic abnormality detected within the abdomen or pelvis. 2. Again noted are acute right-sided rib fractures, better visualized on prior chest CT. 3. Fat containing umbilical hernia. 4. Scattered colonic diverticula without CT evidence for diverticulitis. 5.  Aortic Atherosclerosis (ICD10-I70.0). Electronically Signed   By: Constance Holster M.D.   On: 02/16/2020 21:08    EKG: Independently reviewed.  Atrial fibrillation rate 49, QTc 410.  Prior EKG shows sinus rhythm.  Assessment/Plan Active Problems:   Seizure (HCC)   Unspecified atrial fibrillation (HCC)   Multiple rib fractures    Seizure and fall-sustaining rib fractures, head CT, pelvic CT and right elbow x-ray unremarkable.  Seizure episode witnessed by spouse.  Unable to reach spouse to confirm details on medication history, compliance or frequency of seizures.  Serum carbamazepine level 3.3.  VAproic acid level 55. -Resume home Depakote, Tegretol -Resume primidone - folic acid, thiamine vitamin B12 -Check phosphorus, magnesium - TSH -Neurology consult -Seizure precautions  Multiple rib  fractures-chest x-ray and CTA chest showed nondisplaced fracture of the seventh eighth and ninth rib. -Adequate pain control with as needed Percocet -Incentive spirometry  History of atrial fibrillation, bradycardia- per cardiology notes 2018 diagnosed atria flutter 2017.  Underwent atrial flutter ablation, anticoagulation with warfarin was discontinued due to low CHA2DS2-VASc score.  Even then his heart rate was in the 40s, pre and post ablation.  Vagal nerve stimulator was implanted at some point.  -  Today his CHA2DS2-VASc score is still 0- low risk and so does not require anticoagulation.  - Rate controlled but not on rate limiting agents.  Heart rate bradycardic, mostly 40s.   -Unable to confirm if he is on aspirin.  -Follow-up with primary care provider   DVT prophylaxis: SCDs Code Status: Full code Family Communication: None at bedside, unable to reach patient spouse Hillard Goodwine, got voice mail. Disposition Plan: 1 to 2 days Consults called: Neurology Admission status: Observation, telemetry   Bethena Roys MD [facility]  02/16/2020, 10:05 PM

## 2020-02-16 NOTE — ED Triage Notes (Signed)
Pt states he fell into the clothes racks at [employer] and hurting on his right side. Pt states he hit his head on the rack also, denies LOC or blood thinners.

## 2020-02-16 NOTE — ED Provider Notes (Signed)
[facility] EMERGENCY DEPARTMENT Provider Note   CSN: [id number] Arrival date & time: 02/16/20  1528     History Chief Complaint  Patient presents with  . Fall    Eric [name] is a 64 y.o. male.  HPI   Patient is a 64 year old male with a history of A. fib, hyperlipidemia, seizures, who presents the emergency department today for evaluation after a seizure.  States he was at [employer] when he had a seizure that was witnessed by his wife.  States he hit his head and the right side of his chest on a close rack.  He is currently complaining of pain to the right side of his chest and right elbow. He has a wound to the right elbow.  He denies headache, acute vision changes, lightheadedness, dizziness or any other symptoms.  Denies any neck or back pain.  Denies any abdominal pain.  He states he is not anticoagulated.  He does admit that 2 days ago he missed one of his doses of his seizure medications.  He otherwise has been in his normal state of health and denies any fevers, cough, chest pain, shortness of breath, abdominal pain, nausea, vomiting, diarrhea or urinary symptoms.  Past Medical History:  Diagnosis Date  . A-fib ([place])   . Hypercholesteremia   . Seizures ([place])     There are no problems to display for this patient.   Past Surgical History:  Procedure Laterality Date  . APPENDECTOMY    . CARDIAC ELECTROPHYSIOLOGY STUDY AND ABLATION    . IMPLANTATION VAGAL NERVE STIMULATOR  03/2017       Family History  Problem Relation Age of Onset  . Heart disease Mother        pacemaker in his 43s  . Heart failure Father   . Stroke Father   . Heart attack Brother        45s    Social History   Tobacco Use  . Smoking status: Never Smoker  . Smokeless tobacco: Never Used  Substance Use Topics  . Alcohol use: No  . Drug use: No    Home Medications Prior to Admission medications   Medication Sig Start Date End Date Taking? Authorizing Provider  carbamazepine (TEGRETOL)  200 MG tablet Take 200 mg by mouth 3 (three) times daily.    Yes [provider]  carbidopa-levodopa (SINEMET IR) 25-100 MG tablet Take 2 tablets by mouth 3 (three) times daily.    Yes [provider]  divalproex (DEPAKOTE) 250 MG DR tablet Take 750 mg by mouth 2 (two) times daily.   Yes [provider]  folic acid (FOLVITE) 1 MG tablet Take 2 mg by mouth daily.    Yes [provider]  primidone (MYSOLINE) 250 MG tablet Take 250 mg by mouth 2 (two) times daily.   Yes [provider]  simvastatin (ZOCOR) 20 MG tablet Take 20 mg by mouth at bedtime.    Yes [provider]  thiamine 100 MG tablet Take 100 mg by mouth daily.   Yes [provider]  vitamin B-12 (CYANOCOBALAMIN) 1000 MCG tablet Take 1,000 mcg by mouth daily.   Yes [provider]    Allergies    Patient has no known allergies.  Review of Systems   Review of Systems  Constitutional: Negative for fever.  HENT: Negative for ear pain and sore throat.   Eyes: Negative for visual disturbance.  Respiratory: Negative for cough and shortness of breath.   Cardiovascular:  Positive for chest pain.  Gastrointestinal: Negative for abdominal pain, constipation, diarrhea, nausea and vomiting.  Genitourinary: Negative for dysuria and hematuria.  Musculoskeletal: Negative for back pain and neck pain.       Right elbow pain  Skin: Positive for wound. Negative for rash.  Neurological: Negative for weakness, numbness and headaches.       Head injury, seizure  All other systems reviewed and are negative.   Physical Exam Updated Vital Signs BP 122/67   Pulse (!) 45   Temp 97.8 F (36.6 C) (Oral)   Resp 14   Ht  (1.803 m)   Wt 81.6 kg   SpO2 100%   BMI 25.10 kg/m   Physical Exam Vitals and nursing note reviewed.  Constitutional:      Appearance: He is well-developed.  HENT:     Head: Normocephalic and atraumatic.  Eyes:     Conjunctiva/sclera:  Conjunctivae normal.  Cardiovascular:     Rate and Rhythm: Normal rate and regular rhythm.     Pulses: Normal pulses.     Heart sounds: Normal heart sounds. No murmur.  Pulmonary:     Effort: Pulmonary effort is normal. No respiratory distress.     Breath sounds: Normal breath sounds. No wheezing or rales.     Comments: splinting Chest:     Chest wall: Tenderness (right chest wall TTP) present.  Abdominal:     General: Bowel sounds are normal.     Palpations: Abdomen is soft.     Tenderness: There is no abdominal tenderness. There is no guarding or rebound.  Musculoskeletal:     Cervical back: Neck supple.     Comments: No TTP to the cervical, thoracic, or lumbar spine  Skin:    General: Skin is warm and dry.  Neurological:     Mental Status: He is alert.     Comments: Mental Status:  Alert, thought content appropriate, able to give a coherent history. Speech fluent without evidence of aphasia. Able to follow 2 step commands without difficulty.  Cranial Nerves:  II: pupils equal, round, reactive to light III,IV, VI: ptosis not present, extra-ocular motions intact bilaterally  V,VII: smile symmetric, facial light touch sensation equal VIII: hearing grossly normal to voice  X: uvula elevates symmetrically  XI: bilateral shoulder shrug symmetric and strong XII: midline tongue extension without fassiculations Motor:  Normal tone. 5/5 strength of BUE and BLE major muscle groups including strong and equal grip strength and dorsiflexion/plantar flexion Sensory: light touch normal in all extremities. Gait: normal gait and balance.     ED Results / Procedures / Treatments   Labs (all labs ordered are listed, but only abnormal results are displayed) Labs Reviewed  CBC WITH DIFFERENTIAL/PLATELET - Abnormal; Notable for the following components:      Result Value   RBC 3.62 (*)    Hemoglobin 12.1 (*)    HCT 38.0 (*)    MCV 105.0 (*)    All other components within normal limits   BASIC METABOLIC PANEL - Abnormal; Notable for the following components:   Glucose, Bld 101 (*)    All other components within normal limits  CARBAMAZEPINE LEVEL, TOTAL - Abnormal; Notable for the following components:   Carbamazepine Lvl 3.3 (*)    All other components within normal limits  CBG MONITORING, ED - Abnormal; Notable for the following components:   Glucose-Capillary 115 (*)    All other components within normal limits  SARS CORONAVIRUS 2 (TAT 6-24 HRS)  VALPROIC ACID LEVEL    EKG None  Radiology DG Ribs Unilateral W/Chest Right  Result Date: 02/16/2020 CLINICAL DATA:  Fall into clothes rack at [employer] injuring right side. EXAM: RIGHT RIBS AND CHEST - 3+ VIEW COMPARISON:  Chest radiograph 11/21/2019, CT 05/28/2019 FINDINGS: Nondisplaced fractures of anterior right seventh, eighth, and ninth ribs. There is no evidence of pneumothorax or pleural effusion. Both lungs are clear. Heart size and mediastinal contours are within normal limits. Vagal nerve stimulator on the left. IMPRESSION: Nondisplaced anterior right seventh, eighth, and ninth rib fractures. No pulmonary complication such as pneumothorax. Electronically Signed   By: Narda Rutherford M.D.   On: 02/16/2020 16:40   DG Elbow Complete Right  Result Date: 02/16/2020 CLINICAL DATA:  Recent fall with elbow pain, initial encounter EXAM: RIGHT ELBOW - COMPLETE 3+ VIEW COMPARISON:  None. FINDINGS: There is no evidence of fracture, dislocation, or joint effusion. There is no evidence of arthropathy or other focal bone abnormality. Soft tissues are unremarkable. IMPRESSION: No acute abnormality noted. Electronically Signed   By: Alcide Clever M.D.   On: 02/16/2020 18:51   CT Head Wo Contrast  Result Date: 02/16/2020 CLINICAL DATA:  Posttraumatic headache. Fall at [employer] striking head on clothes rack. EXAM: CT HEAD WITHOUT CONTRAST TECHNIQUE: Contiguous axial images were obtained from the base of the skull through the vertex without  intravenous contrast. COMPARISON:  Head CT 04/09/2018 FINDINGS: Brain: No intracranial hemorrhage, mass effect, or midline shift. Stable degree of atrophy that is mildly advanced for age. Mild chronic small vessel ischemia. No hydrocephalus. The basilar cisterns are patent. No evidence of territorial infarct or acute ischemia. No extra-axial or intracranial fluid collection. Vascular: Atherosclerosis of skullbase vasculature without hyperdense vessel or abnormal calcification. Skull: No fracture or focal lesion. Sinuses/Orbits: No acute findings. Chronic mucous retention cyst/mucosal thickening in left side of sphenoid sinus. Other: None. IMPRESSION: 1. No acute intracranial abnormality. No skull fracture. 2. Stable atrophy and chronic small vessel ischemia. Electronically Signed   By: Narda Rutherford M.D.   On: 02/16/2020 18:48   CT Chest Wo Contrast  Result Date: 02/16/2020 CLINICAL DATA:  Recent fall with right-sided chest pain and rib fractures on recent plain film EXAM: CT CHEST WITHOUT CONTRAST TECHNIQUE: Multidetector CT imaging of the chest was performed following the standard protocol without IV contrast. COMPARISON:  Plain films from earlier in the same day, CT from 05/28/2019. FINDINGS: Cardiovascular: Somewhat limited due to lack of IV contrast. Atherosclerotic calcifications are seen. No significant aneurysmal dilatation is noted. Coronary calcifications are seen. No cardiac enlargement is noted. The pulmonary artery shows a normal size. Mediastinum/Nodes: Thoracic inlet is within normal limits. No hilar or mediastinal adenopathy is noted. No mediastinal hematoma is seen. Lungs/Pleura: Scattered calcified granulomas are noted within the lungs. The lungs are well aerated without focal infiltrate or sizable effusion. No pneumothorax is seen. Minimal emphysematous changes are noted. Upper Abdomen: Visualized upper abdomen is within normal limits. Musculoskeletal: No compression deformities are seen.  No sternal fracture is noted. Mildly displaced fractures of the sixth through ninth ribs on the right are noted similar to that seen on prior plain film examination. No left-sided rib fractures are seen. IMPRESSION: Multiple right-sided rib fractures without complicating factors. Changes of prior granulomatous disease. Aortic Atherosclerosis (ICD10-I70.0) and Emphysema (ICD10-J43.9). Electronically Signed   By: Alcide Clever M.D.   On: 02/16/2020 18:50   CT ABDOMEN PELVIS W CONTRAST  Result Date: 02/16/2020 CLINICAL DATA:  Acute pain due to trauma. EXAM:  CT ABDOMEN AND PELVIS WITH CONTRAST TECHNIQUE: Multidetector CT imaging of the abdomen and pelvis was performed using the standard protocol following bolus administration of intravenous contrast. CONTRAST:  OMNIPAQUE IOHEXOL 300 MG/ML  SOLN COMPARISON:  None. FINDINGS: Lower chest: The lung bases are clear. The heart size is normal. Hepatobiliary: The liver is normal. Normal gallbladder.There is no biliary ductal dilation. Pancreas: Normal contours without ductal dilatation. No peripancreatic fluid collection. Spleen: No splenic laceration or hematoma. Adrenals/Urinary Tract: --Adrenal glands: No adrenal hemorrhage. --Right kidney/ureter: No hydronephrosis or perinephric hematoma. --Left kidney/ureter: No hydronephrosis or perinephric hematoma. --Urinary bladder: The bladder wall is thickened which is felt to be secondary to underdistention. Stomach/Bowel: --Stomach/Duodenum: No hiatal hernia or other gastric abnormality. Normal duodenal course and caliber. --Small bowel: No dilatation or inflammation. --Colon: No focal abnormality. --Appendix: Normal. Vascular/Lymphatic: Atherosclerotic calcification is present within the non-aneurysmal abdominal aorta, without hemodynamically significant stenosis. --No retroperitoneal lymphadenopathy. --No mesenteric lymphadenopathy. --No pelvic or inguinal lymphadenopathy. Reproductive: Unremarkable Other: There is a  trace amount of free fluid in the patient's pelvis. There is a fat containing umbilical hernia. Musculoskeletal. Acute right-sided rib fractures are noted, better visualized on prior chest CT. There are old healed left-sided rib fractures. There is a subcutaneous contusion overlying the proximal right femur. No large hematoma. IMPRESSION: 1. No acute traumatic abnormality detected within the abdomen or pelvis. 2. Again noted are acute right-sided rib fractures, better visualized on prior chest CT. 3. Fat containing umbilical hernia. 4. Scattered colonic diverticula without CT evidence for diverticulitis. 5.  Aortic Atherosclerosis (ICD10-I70.0). Electronically Signed   By: Katherine Mantle M.D.   On: 02/16/2020 21:08    Procedures Procedures (including critical care time)  Medications Ordered in ED Medications  oxyCODONE-acetaminophen (PERCOCET/ROXICET) 5-325 MG per tablet 1 tablet (1 tablet Oral Given 02/16/20 1800)  ondansetron (ZOFRAN) tablet 4 mg (4 mg Oral Given 02/16/20 1800)  Tdap (BOOSTRIX) injection 0.5 mL (0.5 mLs Intramuscular Given 02/16/20 1800)  fentaNYL (SUBLIMAZE) injection 50 mcg (50 mcg Intravenous Given 02/16/20 2019)  iohexol (OMNIPAQUE) 300 MG/ML solution 100 mL (100 mLs Intravenous Contrast Given 02/16/20 2049)    ED Course  I have reviewed the triage vital signs and the nursing notes.  Pertinent labs & imaging results that were available during my care of the patient were reviewed by me and considered in my medical decision making (see chart for details).    MDM Rules/Calculators/A&P                      65 year old male presenting for evaluation after a seizure that occurred prior to arrival.  Complaining of right elbow and right rib pain.  He also sustained head trauma.  Labs reviewed/interpreted CBC with mild anemia, otherwise wnl BMP nonacute Valproic acid level wnl Carbamezapine level is subtherapeutic  All imaging reviewed/interpreted X-ray right elbow with  no acute abnormality noted.  CXR with nondisplaced anterior right seventh, eighth, and ninth rib fractures. No pulmonary complication such as pneumothorax.   CT head no acute intracranial abnormality. No skull fracture. 2. Stable atrophy and chronic small vessel ischemia.  CT chest w/o contrast multiple right-sided rib fractures without complicating factors. Changes of prior granulomatous disease. Aortic atherosclerosis and Emphysema.   Patient does admit to recently missing a dose of his medications which is likely the cause of his breakthrough seizure today.  He has been back to baseline throughout my evaluation in the ED and did not have any recurrence of seizures throughout his  observation period.  He was splinting during exam and could only breathe to 1000 on the incentive spirometer. With his age and comorbidities recommend admission for further tx and observation.   Pt seen in conjunction with Dr. Stevie Kern who personally evaluated the patient and is in agreement with admission. He added CT abd/pelvis as pt had some TTP on his exam.   CT abd/pelvis is without acute abnormality.   9:27 PM CONSULT with Dr. Mariea Clonts with hospitalist service who accepts patient for admission.    Final Clinical Impression(s) / ED Diagnoses Final diagnoses:  Closed fracture of multiple ribs of right side, initial encounter    Rx / DC Orders ED Discharge Orders    None       Rayne Du 02/16/20 2128    Milagros Loll, MD 02/17/20 1530

## 2020-02-16 NOTE — Progress Notes (Signed)
Patient arrived to floor with clothing, glasses and watch.  Advised him to send clothing home or watch home with wife during visiting hours.  Patient verbalized understanding and understood [facility] policy when it came to belongings.

## 2020-02-17 ENCOUNTER — Inpatient Hospital Stay (HOSPITAL_COMMUNITY)
Admit: 2020-02-17 | Discharge: 2020-02-17 | Disposition: A | Discharge status: Home / Self Care | Payer: Medicare Other | Attending: Internal Medicine | Admitting: Internal Medicine

## 2020-02-17 DIAGNOSIS — R251 Tremor, unspecified: Secondary | ICD-10-CM | POA: Diagnosis present

## 2020-02-17 DIAGNOSIS — S0990XA Unspecified injury of head, initial encounter: Secondary | ICD-10-CM | POA: Diagnosis present

## 2020-02-17 DIAGNOSIS — S2241XA Multiple fractures of ribs, right side, initial encounter for closed fracture: Secondary | ICD-10-CM | POA: Diagnosis not present

## 2020-02-17 DIAGNOSIS — E7800 Pure hypercholesterolemia, unspecified: Secondary | ICD-10-CM | POA: Diagnosis present

## 2020-02-17 DIAGNOSIS — J439 Emphysema, unspecified: Secondary | ICD-10-CM | POA: Diagnosis present

## 2020-02-17 DIAGNOSIS — E785 Hyperlipidemia, unspecified: Secondary | ICD-10-CM | POA: Diagnosis present

## 2020-02-17 DIAGNOSIS — Z823 Family history of stroke: Secondary | ICD-10-CM | POA: Diagnosis not present

## 2020-02-17 DIAGNOSIS — Z20822 Contact with and (suspected) exposure to covid-19: Secondary | ICD-10-CM | POA: Diagnosis present

## 2020-02-17 DIAGNOSIS — R001 Bradycardia, unspecified: Secondary | ICD-10-CM | POA: Diagnosis present

## 2020-02-17 DIAGNOSIS — Y92512 Supermarket, store or market as the place of occurrence of the external cause: Secondary | ICD-10-CM | POA: Diagnosis not present

## 2020-02-17 DIAGNOSIS — Z23 Encounter for immunization: Secondary | ICD-10-CM | POA: Diagnosis not present

## 2020-02-17 DIAGNOSIS — G40919 Epilepsy, unspecified, intractable, without status epilepticus: Secondary | ICD-10-CM | POA: Diagnosis present

## 2020-02-17 DIAGNOSIS — R569 Unspecified convulsions: Secondary | ICD-10-CM

## 2020-02-17 DIAGNOSIS — R4182 Altered mental status, unspecified: Secondary | ICD-10-CM | POA: Diagnosis not present

## 2020-02-17 DIAGNOSIS — G9340 Encephalopathy, unspecified: Secondary | ICD-10-CM | POA: Diagnosis present

## 2020-02-17 DIAGNOSIS — I4891 Unspecified atrial fibrillation: Secondary | ICD-10-CM | POA: Diagnosis present

## 2020-02-17 DIAGNOSIS — Z8249 Family history of ischemic heart disease and other diseases of the circulatory system: Secondary | ICD-10-CM | POA: Diagnosis not present

## 2020-02-17 DIAGNOSIS — W1839XA Other fall on same level, initial encounter: Secondary | ICD-10-CM | POA: Diagnosis present

## 2020-02-17 DIAGNOSIS — I700 Atherosclerosis of aorta: Secondary | ICD-10-CM | POA: Diagnosis present

## 2020-02-17 DIAGNOSIS — Z79899 Other long term (current) drug therapy: Secondary | ICD-10-CM | POA: Diagnosis not present

## 2020-02-17 LAB — HIV ANTIBODY (ROUTINE TESTING W REFLEX): HIV Screen 4th Generation wRfx: NONREACTIVE

## 2020-02-17 LAB — SARS CORONAVIRUS 2 (TAT 6-24 HRS): SARS Coronavirus 2: NEGATIVE

## 2020-02-17 MED ORDER — LIDOCAINE 5 % EX PTCH
1.0000 | MEDICATED_PATCH | CUTANEOUS | Status: DC
  Administered 2020-02-17: 1 via TRANSDERMAL
  Filled 2020-02-17: qty 1

## 2020-02-17 MED ORDER — LORAZEPAM 2 MG/ML IJ SOLN: 2.0000 mg | INTRAMUSCULAR | Status: DC | PRN

## 2020-02-17 NOTE — Consult Note (Addendum)
[facility] Karthik Whittinghill A. Gerilyn Pilgrimoonquah, MD     [email or web address]          Eric [name] is an 64 y.o. male.   ASSESSMENT/PLAN: 1.  Apparent breakthrough seizure in a gentleman who appears to have poorly controlled seizures.  This raises the potential that some of these events may not be truly epileptic as the patient is on 3 antiepileptic medications.  We will suggest however that the Tegretol be increased to 300 mg 3 times a day.  We also will suggest that he continues with the Mysoline and the Depakote.  Consideration should be given for the patient to have long-term EEG video monitoring to evaluate the spells if not done already through the [facility] system.  As usual seizure precaution including no driving or operating machinery is recommended.  2.  Apparent history of tremors but unclear if these are nonspecific, Parkinson's related or due to essential tremor.  3.  Markedly diminished reflexes suggestive of neuropathy:  This is a 64 year old white male who apparently has a long history of seizures.  He does have limited insight into his medical history possibly due to his seizure.  It appears that the patient has been confused after being hospitalized with the seizures.  He seems to tell me that he could recognize when the seizures would occur but recently he simply blacks out without having any warning.  He that he has convulsions when he has a seizure. he tells me that his seizures been present for a long time but relates to me that his wife informed him that his seizures have been present since 1975.  He goes to the [facility] for most of his care.  He seems to intimate that his seizures occur about once a month.  It appears that he has had no recent changes in medications.  He cannot provide me with a history as far as what he is taking or how he is taking his medications.  The wife is not present but it appears that she helps administer his medications.  Compliance is therefore possibly an issue.   He does complain of chest pain.  This is on the right side.  The review of systems limited given the patient's mild encephalopathy.   GENERAL: The patient is doing okay at this time.  He he is in some discomfort especially during the evaluation and examination.  HEENT: Neck is supple and no tongue trauma or or oral trauma is appreciated.  No other trauma is noted.  ABDOMEN: soft  EXTREMITIES: No edema   BACK: Normal  SKIN: Normal by inspection.    MENTAL STATUS: He is awake and alert and knows that he is in the hospital at [facility].  However, he has limited insight into his overall health and wellbeing.  He thinks that he is here because he has right-sided chest discomfort and pain.  He seemed not to know that he had a seizure.  CRANIAL NERVES: Pupils are equal, round and reactive to light and accomodation; extra ocular movements are full, there is no significant nystagmus; visual fields are full; upper and lower facial muscles are normal in strength and symmetric, there is no flattening of the nasolabial folds; tongue is midline; uvula is midline; shoulder elevation is normal.  MOTOR: Normal tone, bulk and strength; no pronator drift.  COORDINATION: Left finger to nose is normal, right finger to nose is normal, No rest tremor; no intention tremor; no postural tremor; no bradykinesia.  REFLEXES: Deep tendon reflexes  are symmetrical and normal but markedly diminished in the legs 1+ requiring augmentation. Plantar reflexes are flexor bilaterally.   SENSATION: Normal to light touch, temperature, and pain.      Blood pressure 109/67, pulse (!) 52, temperature 98.7 F (37.1 C), temperature source Oral, resp. rate 19, height 5\' 11"  (1.803 m), weight 81.6 kg, SpO2 100 %.  Past Medical History:  Diagnosis Date  . A-fib ([place])   . Hypercholesteremia   . Seizures ([place])     Past Surgical History:  Procedure Laterality Date  . APPENDECTOMY    . CARDIAC ELECTROPHYSIOLOGY STUDY AND  ABLATION    . IMPLANTATION VAGAL NERVE STIMULATOR  03/2017    Family History  Problem Relation Age of Onset  . Heart disease Mother        pacemaker in his 26s  . Heart failure Father   . Stroke Father   . Heart attack Brother        35s    Social History:  reports that he has never smoked. He has never used smokeless tobacco. He reports that he does not drink alcohol or use drugs.  Allergies: No Known Allergies  Medications: Prior to Admission medications   Medication Sig Start Date End Date Taking? Authorizing Provider  carbamazepine (TEGRETOL) 200 MG tablet Take 200 mg by mouth 3 (three) times daily.    Yes [provider]  carbidopa-levodopa (SINEMET IR) 25-100 MG tablet Take 2 tablets by mouth 3 (three) times daily.    Yes [provider]  divalproex (DEPAKOTE) 250 MG DR tablet Take 750 mg by mouth 2 (two) times daily.   Yes [provider]  folic acid (FOLVITE) 1 MG tablet Take 2 mg by mouth daily.    Yes [provider]  primidone (MYSOLINE) 250 MG tablet Take 250 mg by mouth 2 (two) times daily.   Yes [provider]  simvastatin (ZOCOR) 20 MG tablet Take 20 mg by mouth at bedtime.    Yes [provider]  thiamine 100 MG tablet Take 100 mg by mouth daily.   Yes [provider]  vitamin B-12 (CYANOCOBALAMIN) 1000 MCG tablet Take 1,000 mcg by mouth daily.   Yes [provider]    Scheduled Meds: . carbamazepine  200 mg Oral TID  . carbidopa-levodopa  2 tablet Oral TID  . divalproex  750 mg Oral BID  . folic acid  2 mg Oral Daily  . lidocaine  1 patch Transdermal Q24H  . primidone  250 mg Oral BID  . simvastatin  20 mg Oral QHS  . thiamine  100 mg Oral Daily  . vitamin B-12  1,000 mcg Oral Daily   Continuous Infusions: PRN Meds:.acetaminophen **OR** acetaminophen, HYDROcodone-acetaminophen, LORazepam, ondansetron **OR** ondansetron (ZOFRAN) IV, polyethylene glycol     Results for orders placed  or performed during the hospital encounter of 02/16/20 (from the past 48 hour(s))  POC CBG, ED     Status: Abnormal   Collection Time: 02/16/20  5:49 PM  Result Value Ref Range   Glucose-Capillary 115 (H) 70 - 99 mg/dL    Comment: Glucose reference range applies only to samples taken after fasting for at least 8 hours.  CBC with Differential     Status: Abnormal   Collection Time: 02/16/20  5:51 PM  Result Value Ref Range   WBC 7.8 4.0 - 10.5 K/uL   RBC 3.62 (L) 4.22 - 5.81 MIL/uL   Hemoglobin 12.1 (L) 13.0 - 17.0 g/dL  HCT 38.0 (L) 39.0 - 52.0 %   MCV 105.0 (H) 80.0 - 100.0 fL   MCH 33.4 26.0 - 34.0 pg   MCHC 31.8 30.0 - 36.0 g/dL   RDW 56.3 87.5 - 64.3 %   Platelets 167 150 - 400 K/uL   nRBC 0.0 0.0 - 0.2 %   Neutrophils Relative % 71 %   Neutro Abs 5.6 1.7 - 7.7 K/uL   Lymphocytes Relative 16 %   Lymphs Abs 1.3 0.7 - 4.0 K/uL   Monocytes Relative 12 %   Monocytes Absolute 0.9 0.1 - 1.0 K/uL   Eosinophils Relative 0 %   Eosinophils Absolute 0.0 0.0 - 0.5 K/uL   Basophils Relative 0 %   Basophils Absolute 0.0 0.0 - 0.1 K/uL   Immature Granulocytes 1 %   Abs Immature Granulocytes 0.04 0.00 - 0.07 K/uL    Comment: Performed at [facility], [address], [place], [place] [address]  Basic metabolic panel     Status: Abnormal   Collection Time: 02/16/20  5:51 PM  Result Value Ref Range   Sodium 140 135 - 145 mmol/L   Potassium 4.4 3.5 - 5.1 mmol/L   Chloride 103 98 - 111 mmol/L   CO2 28 22 - 32 mmol/L   Glucose, Bld 101 (H) 70 - 99 mg/dL    Comment: Glucose reference range applies only to samples taken after fasting for at least 8 hours.   BUN 21 8 - 23 mg/dL   Creatinine, Ser 8.84 0.61 - 1.24 mg/dL   Calcium 8.9 8.9 - 16.6 mg/dL   GFR calc non Af Amer >60 >60 mL/min   GFR calc Af Amer >60 >60 mL/min   Anion gap 9 5 - 15    Comment: Performed at [facility], [address], [place], [place] [address]  Carbamazepine level, total     Status: Abnormal   Collection Time:  02/16/20  5:51 PM  Result Value Ref Range   Carbamazepine Lvl 3.3 (L) 4.0 - 12.0 ug/mL    Comment: Performed at [facility], [address], [place], [place] [address]  Valproic acid level     Status: None   Collection Time: 02/16/20  5:51 PM  Result Value Ref Range   Valproic Acid Lvl 55 50.0 - 100.0 ug/mL    Comment: Performed at [facility], [address], [place], [place] [address]  Magnesium     Status: None   Collection Time: 02/16/20  5:51 PM  Result Value Ref Range   Magnesium 2.1 1.7 - 2.4 mg/dL    Comment: Performed at [facility], [address], [place], [place] [address]  Phosphorus     Status: None   Collection Time: 02/16/20  5:51 PM  Result Value Ref Range   Phosphorus 3.5 2.5 - 4.6 mg/dL    Comment: Performed at [facility], [address], [place], [place] [address]  TSH     Status: None   Collection Time: 02/16/20  5:51 PM  Result Value Ref Range   TSH 0.919 0.350 - 4.500 uIU/mL    Comment: Performed by a 3rd Generation assay with a functional sensitivity of <=0.01 uIU/mL. Performed at [facility], [address] [place], [place] [address]   HIV Antibody (routine testing w rflx)     Status: None   Collection Time: 02/16/20  5:51 PM  Result Value Ref Range   HIV Screen 4th Generation wRfx NON REACTIVE NON REACTIVE    Comment: Performed at  [facility], [address], [place], [place] [address]  SARS CORONAVIRUS 2 (TAT 6-24 HRS) Nasopharyngeal Nasopharyngeal Swab     Status: None   Collection Time: 02/16/20  8:07 PM   Specimen: Nasopharyngeal Swab  Result Value Ref Range   SARS Coronavirus 2 NEGATIVE NEGATIVE    Comment: (NOTE) SARS-CoV-2 target nucleic acids are NOT DETECTED. The SARS-CoV-2 RNA is generally detectable in upper and lower respiratory specimens during the acute phase of infection. Negative results do not preclude SARS-CoV-2 infection, do not rule out co-infections with other pathogens, and should not be used as the sole basis for  treatment or other patient management decisions. Negative results must be combined with clinical observations, patient history, and epidemiological information. The expected result is Negative. Fact Sheet for Patients: HairSlick.no Fact Sheet for Healthcare Providers: quierodirigir.com This test is not yet approved or cleared by the [place] FDA and  has been authorized for detection and/or diagnosis of SARS-CoV-2 by FDA under an Emergency Use Authorization (EUA). This EUA will remain  in effect (meaning this test can be used) for the duration of the COVID-19 declaration under Section 56 4(b)(1) of the Act, 21 U.S.C. section 360bbb-3(b)(1), unless the authorization is terminated or revoked sooner. Performed at [facility], [address], [place], [place] [address]     Studies/Results:  HEAD CT FINDINGS: Brain: No intracranial hemorrhage, mass effect, or midline shift. Stable degree of atrophy that is mildly advanced for age. Mild chronic small vessel ischemia. No hydrocephalus. The basilar cisterns are patent. No evidence of territorial infarct or acute ischemia. No extra-axial or intracranial fluid collection.  Vascular: Atherosclerosis of skullbase vasculature without hyperdense vessel or abnormal calcification.  Skull: No fracture or focal lesion.  Sinuses/Orbits: No acute findings. Chronic mucous retention cyst/mucosal thickening in left side of sphenoid sinus.  Other: None.  IMPRESSION: 1. No acute intracranial abnormality. No skull fracture. 2. Stable atrophy and chronic small vessel ischemia.    ADB CT IMPRESSION: 1. No acute traumatic abnormality detected within the abdomen or pelvis. 2. Again noted are acute right-sided rib fractures, better visualized on prior chest CT. 3. Fat containing umbilical hernia. 4. Scattered colonic diverticula without CT evidence for diverticulitis. 5.   Aortic Atherosclerosis (ICD10-I70.0).    CT CHEST IMPRESSION: Multiple right-sided rib fractures without complicating factors.  Changes of prior granulomatous disease.  Aortic Atherosclerosis (ICD10-I70.0) and Emphysema (ICD10-J43.9).     EXAM: RIGHT ELBOW - COMPLETE 3+ VIEW  COMPARISON:  None.  FINDINGS: There is no evidence of fracture, dislocation, or joint effusion. There is no evidence of arthropathy or other focal bone abnormality. Soft tissues are unremarkable.  IMPRESSION: No acute abnormality noted.     EEG Technical aspects: This EEG study was done with scalp electrodes positioned according to the 10-20 International system of electrode placement. Electrical activity was acquired at a sampling rate of 500Hz  and reviewed with a high frequency filter of 70Hz  and a low frequency filter of 1Hz . EEG data were recorded continuously and digitally stored.   Description: No clear posterior dominant rhythm was seen.  EEG showed continuous generalized 3 to 6 Hz theta-delta slowing.  Physiologic photic driving was not seen during photic stimulation.  Hyperventilation was not performed.  Abnormality - Continuous slow, generalized  IMPRESSION: This study is suggestive of mild diffuse encephalopathy, nonspecific to etiology. No seizures or epileptiform discharges were seen throughout the recording.     The head CT scan is reviewed in person in shows  significant global atrophy but otherwise unrevealing.   Jairo Bellew A. Gerilyn Pilgrim, M.D.  Diplomate, Biomedical engineer of Psychiatry and Neurology ( Neurology). 02/17/2020, 6:08 PM

## 2020-02-17 NOTE — Progress Notes (Signed)
PROGRESS NOTE    Eric [name]  VFI:[id number] DOB: 10-20-1956 DOA: 02/16/2020 PCP: System, Pcp Not In   Brief Narrative:  Per HPI: Eric [name] is a 64 y.o. male with medical history significant for  Seizures, Atria fibrillation.  Patient presented to the ED with complaints of seizure episode and subsequent fall.  Patient was asked to get today when he had a seizure that was witnessed by his wife.  He fell into a clothing rack hitting the right side of his body.  He tells me the seizure lasted about 2 minutes. He is unable to give me further details about his medications. Patient reports that he missed a dose of his seizure medications about 2 days ago- " the new medication" ,  but he is not sure what the name of it is.  He is also unable to tell me if he has been having frequent seizures and when his last seizure episode was. He denies chest pain, difficulty breathing or dizziness prior to episode today.  3/11: Patient admitted with seizures and fall resulting in multiple rib fractures on the right side.  He is currently complaining of some pain, but otherwise denies any headache or other issues.  Does not appear confused.  Assessment & Plan:   Principal Problem:   Multiple rib fractures Active Problems:   Seizure (HCC)   Unspecified atrial fibrillation (HCC)   Seizure with fall -Continue home Depakote and Tegretol as well as primidone -TSH within normal limits -Seizure precautions with IV Ativan added as needed -Neurology evaluation appreciated and pending -EEG ordered  Multiple rib fractures-right -Nondisplaced fractures of ribs 7, 8, and 9 -Continue current pain management -Incentive spirometry  History of atrial fibrillation/bradycardia -CHA2DS2-VASc is 0 and does not require anticoagulation -Remains bradycardic and not on rate control medications with heart rate 40-50 bpm -Continue close monitoring   DVT prophylaxis: SCDs Code Status: Full Family Communication:  We will plan to call wife Disposition Plan: EEG and neurology evaluation pending.  Pain management for rib fractures.  Continue IS.  Anticipate discharge by a.m. if pain well controlled, and no other acute issues noted   Consultants:   Neurology  Procedures:   EEG pending  Antimicrobials:   None   Subjective: Patient seen and evaluated today with no new acute complaints or concerns. No acute concerns or events noted overnight.  Continues to complain of pain related to rib fractures.  No further seizure activity noted since admission.  Objective: Vitals:   02/16/20 2145 02/16/20 2200 02/16/20 2230 02/17/20 0426  BP:  102/69 131/73 96/66  Pulse: (!) 52 (!) 51 (!) 44 (!) 55  Resp:   16 14  Temp:   98.7 F (37.1 C) 97.8 F (36.6 C)  TempSrc:   Oral Oral  SpO2: 100% 99% 100% 98%  Weight:      Height:        Intake/Output Summary (Last 24 hours) at 02/17/2020 1331 Last data filed at 02/17/2020 0900 Gross per 24 hour  Intake --  Output 700 ml  Net -700 ml   Filed Weights   02/16/20 1552  Weight: 81.6 kg    Examination:  General exam: Appears calm and comfortable  Respiratory system: Clear to auscultation. Respiratory effort normal.  Tenderness to palpation over rib cage. Cardiovascular system: S1 & S2 heard, RRR. No JVD, murmurs, rubs, gallops or clicks. No pedal edema. Gastrointestinal system: Abdomen is nondistended, soft and nontender. No organomegaly or masses felt. Normal bowel sounds heard.  Central nervous system: Alert and oriented. No focal neurological deficits. Extremities: Symmetric 5 x 5 power. Skin: No rashes, lesions or ulcers Psychiatry: Judgement and insight appear normal. Mood & affect appropriate.     Data Reviewed: I have personally reviewed following labs and imaging studies  CBC: Recent Labs  Lab 02/16/20 1751  WBC 7.8  NEUTROABS 5.6  HGB 12.1*  HCT 38.0*  MCV 105.0*  PLT 167   Basic Metabolic Panel: Recent Labs  Lab  02/16/20 1751  NA 140  K 4.4  CL 103  CO2 28  GLUCOSE 101*  BUN 21  CREATININE 0.86  CALCIUM 8.9  MG 2.1  PHOS 3.5   GFR: Estimated Creatinine Clearance: 92.4 mL/min (by C-G formula based on SCr of 0.86 mg/dL). Liver Function Tests: No results for input(s): AST, ALT, ALKPHOS, BILITOT, PROT, ALBUMIN in the last 168 hours. No results for input(s): LIPASE, AMYLASE in the last 168 hours. No results for input(s): AMMONIA in the last 168 hours. Coagulation Profile: No results for input(s): INR, PROTIME in the last 168 hours. Cardiac Enzymes: No results for input(s): CKTOTAL, CKMB, CKMBINDEX, TROPONINI in the last 168 hours. BNP (last 3 results) No results for input(s): PROBNP in the last 8760 hours. HbA1C: No results for input(s): HGBA1C in the last 72 hours. CBG: Recent Labs  Lab 02/16/20 1749  GLUCAP 115*   Lipid Profile: No results for input(s): CHOL, HDL, LDLCALC, TRIG, CHOLHDL, LDLDIRECT in the last 72 hours. Thyroid Function Tests: Recent Labs    02/16/20 1751  TSH 0.919   Anemia Panel: No results for input(s): VITAMINB12, FOLATE, FERRITIN, TIBC, IRON, RETICCTPCT in the last 72 hours. Sepsis Labs: No results for input(s): PROCALCITON, LATICACIDVEN in the last 168 hours.  Recent Results (from the past 240 hour(s))  SARS CORONAVIRUS 2 (TAT 6-24 HRS) Nasopharyngeal Nasopharyngeal Swab     Status: None   Collection Time: 02/16/20  8:07 PM   Specimen: Nasopharyngeal Swab  Result Value Ref Range Status   SARS Coronavirus 2 NEGATIVE NEGATIVE Final    Comment: (NOTE) SARS-CoV-2 target nucleic acids are NOT DETECTED. The SARS-CoV-2 RNA is generally detectable in upper and lower respiratory specimens during the acute phase of infection. Negative results do not preclude SARS-CoV-2 infection, do not rule out co-infections with other pathogens, and should not be used as the sole basis for treatment or other patient management decisions. Negative results must be combined  with clinical observations, patient history, and epidemiological information. The expected result is Negative. Fact Sheet for Patients: HairSlick.no Fact Sheet for Healthcare Providers: quierodirigir.com This test is not yet approved or cleared by the [place] FDA and  has been authorized for detection and/or diagnosis of SARS-CoV-2 by FDA under an Emergency Use Authorization (EUA). This EUA will remain  in effect (meaning this test can be used) for the duration of the COVID-19 declaration under Section 56 4(b)(1) of the Act, 21 U.S.C. section 360bbb-3(b)(1), unless the authorization is terminated or revoked sooner. Performed at [facility], [address], [place], [place] [address]          Radiology Studies: DG Ribs Unilateral W/Chest Right  Result Date: 02/16/2020 CLINICAL DATA:  Fall into clothes rack at [employer] injuring right side. EXAM: RIGHT RIBS AND CHEST - 3+ VIEW COMPARISON:  Chest radiograph 11/21/2019, CT 05/28/2019 FINDINGS: Nondisplaced fractures of anterior right seventh, eighth, and ninth ribs. There is no evidence of pneumothorax or pleural effusion. Both lungs are clear. Heart size and mediastinal contours  are within normal limits. Vagal nerve stimulator on the left. IMPRESSION: Nondisplaced anterior right seventh, eighth, and ninth rib fractures. No pulmonary complication such as pneumothorax. Electronically Signed   By: Narda Rutherford M.D.   On: 02/16/2020 16:40   DG Elbow Complete Right  Result Date: 02/16/2020 CLINICAL DATA:  Recent fall with elbow pain, initial encounter EXAM: RIGHT ELBOW - COMPLETE 3+ VIEW COMPARISON:  None. FINDINGS: There is no evidence of fracture, dislocation, or joint effusion. There is no evidence of arthropathy or other focal bone abnormality. Soft tissues are unremarkable. IMPRESSION: No acute abnormality noted. Electronically Signed   By: Alcide Clever M.D.   On:  02/16/2020 18:51   CT Head Wo Contrast  Result Date: 02/16/2020 CLINICAL DATA:  Posttraumatic headache. Fall at [employer] striking head on clothes rack. EXAM: CT HEAD WITHOUT CONTRAST TECHNIQUE: Contiguous axial images were obtained from the base of the skull through the vertex without intravenous contrast. COMPARISON:  Head CT 04/09/2018 FINDINGS: Brain: No intracranial hemorrhage, mass effect, or midline shift. Stable degree of atrophy that is mildly advanced for age. Mild chronic small vessel ischemia. No hydrocephalus. The basilar cisterns are patent. No evidence of territorial infarct or acute ischemia. No extra-axial or intracranial fluid collection. Vascular: Atherosclerosis of skullbase vasculature without hyperdense vessel or abnormal calcification. Skull: No fracture or focal lesion. Sinuses/Orbits: No acute findings. Chronic mucous retention cyst/mucosal thickening in left side of sphenoid sinus. Other: None. IMPRESSION: 1. No acute intracranial abnormality. No skull fracture. 2. Stable atrophy and chronic small vessel ischemia. Electronically Signed   By: Narda Rutherford M.D.   On: 02/16/2020 18:48   CT Chest Wo Contrast  Result Date: 02/16/2020 CLINICAL DATA:  Recent fall with right-sided chest pain and rib fractures on recent plain film EXAM: CT CHEST WITHOUT CONTRAST TECHNIQUE: Multidetector CT imaging of the chest was performed following the standard protocol without IV contrast. COMPARISON:  Plain films from earlier in the same day, CT from 05/28/2019. FINDINGS: Cardiovascular: Somewhat limited due to lack of IV contrast. Atherosclerotic calcifications are seen. No significant aneurysmal dilatation is noted. Coronary calcifications are seen. No cardiac enlargement is noted. The pulmonary artery shows a normal size. Mediastinum/Nodes: Thoracic inlet is within normal limits. No hilar or mediastinal adenopathy is noted. No mediastinal hematoma is seen. Lungs/Pleura: Scattered calcified granulomas  are noted within the lungs. The lungs are well aerated without focal infiltrate or sizable effusion. No pneumothorax is seen. Minimal emphysematous changes are noted. Upper Abdomen: Visualized upper abdomen is within normal limits. Musculoskeletal: No compression deformities are seen. No sternal fracture is noted. Mildly displaced fractures of the sixth through ninth ribs on the right are noted similar to that seen on prior plain film examination. No left-sided rib fractures are seen. IMPRESSION: Multiple right-sided rib fractures without complicating factors. Changes of prior granulomatous disease. Aortic Atherosclerosis (ICD10-I70.0) and Emphysema (ICD10-J43.9). Electronically Signed   By: Alcide Clever M.D.   On: 02/16/2020 18:50   CT ABDOMEN PELVIS W CONTRAST  Result Date: 02/16/2020 CLINICAL DATA:  Acute pain due to trauma. EXAM: CT ABDOMEN AND PELVIS WITH CONTRAST TECHNIQUE: Multidetector CT imaging of the abdomen and pelvis was performed using the standard protocol following bolus administration of intravenous contrast. CONTRAST:  OMNIPAQUE IOHEXOL 300 MG/ML  SOLN COMPARISON:  None. FINDINGS: Lower chest: The lung bases are clear. The heart size is normal. Hepatobiliary: The liver is normal. Normal gallbladder.There is no biliary ductal dilation. Pancreas: Normal contours without ductal dilatation. No peripancreatic fluid collection. Spleen:  No splenic laceration or hematoma. Adrenals/Urinary Tract: --Adrenal glands: No adrenal hemorrhage. --Right kidney/ureter: No hydronephrosis or perinephric hematoma. --Left kidney/ureter: No hydronephrosis or perinephric hematoma. --Urinary bladder: The bladder wall is thickened which is felt to be secondary to underdistention. Stomach/Bowel: --Stomach/Duodenum: No hiatal hernia or other gastric abnormality. Normal duodenal course and caliber. --Small bowel: No dilatation or inflammation. --Colon: No focal abnormality. --Appendix: Normal. Vascular/Lymphatic:  Atherosclerotic calcification is present within the non-aneurysmal abdominal aorta, without hemodynamically significant stenosis. --No retroperitoneal lymphadenopathy. --No mesenteric lymphadenopathy. --No pelvic or inguinal lymphadenopathy. Reproductive: Unremarkable Other: There is a trace amount of free fluid in the patient's pelvis. There is a fat containing umbilical hernia. Musculoskeletal. Acute right-sided rib fractures are noted, better visualized on prior chest CT. There are old healed left-sided rib fractures. There is a subcutaneous contusion overlying the proximal right femur. No large hematoma. IMPRESSION: 1. No acute traumatic abnormality detected within the abdomen or pelvis. 2. Again noted are acute right-sided rib fractures, better visualized on prior chest CT. 3. Fat containing umbilical hernia. 4. Scattered colonic diverticula without CT evidence for diverticulitis. 5.  Aortic Atherosclerosis (ICD10-I70.0). Electronically Signed   By: Constance Holster M.D.   On: 02/16/2020 21:08        Scheduled Meds: . carbamazepine  200 mg Oral TID  . carbidopa-levodopa  2 tablet Oral TID  . divalproex  750 mg Oral BID  . folic acid  2 mg Oral Daily  . primidone  250 mg Oral BID  . simvastatin  20 mg Oral QHS  . thiamine  100 mg Oral Daily  . vitamin B-12  1,000 mcg Oral Daily   Continuous Infusions:   LOS: 0 days    Time spent: 35 minutes    [employer] Darleen Crocker, DO [facility]  If 7PM-7AM, please contact night-coverage [email or web address] 02/17/2020, 1:31 PM

## 2020-02-17 NOTE — Procedures (Signed)
Patient Name: Eric [name]  MRN: [id number]  Epilepsy Attending: Charlsie Quest  Referring Physician/Provider: Dr. Maurilio Lovely Date: 02/17/2020 Duration: 27.25 mins  Patient history: 64 year old male with history of epilepsy presented with fall.  EEG evaluate for seizures.  Level of alertness: Awake  AEDs during EEG study: Carbamazepine, valproic acid  Technical aspects: This EEG study was done with scalp electrodes positioned according to the 10-20 International system of electrode placement. Electrical activity was acquired at a sampling rate of 500Hz  and reviewed with a high frequency filter of 70Hz  and a low frequency filter of 1Hz . EEG data were recorded continuously and digitally stored.   Description: No clear posterior dominant rhythm was seen.  EEG showed continuous generalized 3 to 6 Hz theta-delta slowing.  Physiologic photic driving was not seen during photic stimulation.  Hyperventilation was not performed.  Abnormality - Continuous slow, generalized  IMPRESSION: This study is suggestive of mild diffuse encephalopathy, nonspecific to etiology. No seizures or epileptiform discharges were seen throughout the recording.  [name] 

## 2020-02-17 NOTE — Progress Notes (Signed)
EEG complete - results pending 

## 2020-02-18 MED ORDER — CARBAMAZEPINE 200 MG PO TABS: 300.0000 mg | ORAL_TABLET | Freq: Three times a day (TID) | ORAL | 3 refills | Status: AC

## 2020-02-18 MED ORDER — HYDROCODONE-ACETAMINOPHEN 5-325 MG PO TABS: 1.0000 | ORAL_TABLET | ORAL | 0 refills | Status: AC | PRN

## 2020-02-18 MED ORDER — LIDOCAINE 5 % EX PTCH: 1.0000 | MEDICATED_PATCH | CUTANEOUS | 0 refills | Status: AC

## 2020-02-18 NOTE — Discharge Summary (Signed)
Physician Discharge Summary  Eric [name] QMG:[id number] DOB: 12-13-1955 DOA: 02/16/2020  PCP: System, Pcp Not In  Admit date: 02/16/2020  Discharge date: 02/18/2020  Admitted From:Home  Disposition:  Home  Recommendations for Outpatient Follow-up:  1. Follow up with PCP in 1-2 weeks 2. Follow-up with neurology, Dr. Gerilyn Pilgrim in 4 weeks 3. Continue on increased dose of Tegretol from 200 3 times daily to 300 3 times daily now 4. Continue other antiepileptics as prior 5. Pain medication with Norco 20 tablets given and 0 refills as well as lidocaine patch for rib fractures  Home Health: Yes with PT  Equipment/Devices: Has home walker  Discharge Condition: Stable  CODE STATUS: Full  Diet recommendation: Heart Healthy  Brief/Interim Summary: Per HPI: Eric [name] a 64 y.o.malewith medical history significant forseizures and atrial fibrillation-not on anticoagulation or rate control medications.Patient presented to the ED with complaints of seizure episode and subsequent fall. Patient was asked to get today when he had a seizure that was witnessed by his wife. He fell into a clothing rackhitting the right side of his body.He tells me the seizure lasted about 2 minutes. He is unable to give me further details about his medications.Patient reports that he missed a dose of his seizure medications about 2 days ago- " the newmedication" ,but he is not sure what the name of it is. He is also unable to tell me if he has been having frequent seizures and when his last seizure episode was. He denies chest pain, difficulty breathing or dizziness prior to episode today.  3/11: Patient admitted with seizures and fall resulting in multiple rib fractures on the right side.  He is currently complaining of some pain, but otherwise denies any headache or other issues.  Does not appear confused.  3/12: Patient has not had any seizure activity during the course of this admission.  He  has been seen by neurology with recommendations to increase Tegretol to 300 mg 3 times daily and has had EEG with generalized slowing and no ongoing seizure activity otherwise noted.  He is overall doing quite well and has good pain management.  He has been seen by physical therapy and can ambulate with walker and will require some home health PT.  He is otherwise stable for discharge with no other acute events noted throughout the course of this admission.  Discharge Diagnoses:  Principal Problem:   Multiple rib fractures Active Problems:   Seizure (HCC)   Unspecified atrial fibrillation (HCC)  Principal discharge diagnosis: Seizure with associated fall resulting in right-sided multiple rib fractures.  Discharge Instructions  Discharge Instructions    Diet - low sodium heart healthy   Complete by: As directed    Increase activity slowly   Complete by: As directed      Allergies as of 02/18/2020   No Known Allergies     Medication List    TAKE these medications   carbamazepine 200 MG tablet Commonly known as: TEGRETOL Take 1.5 tablets (300 mg total) by mouth 3 (three) times daily. What changed: how much to take   carbidopa-levodopa 25-100 MG tablet Commonly known as: SINEMET IR Take 2 tablets by mouth 3 (three) times daily.   divalproex 250 MG DR tablet Commonly known as: DEPAKOTE Take 750 mg by mouth 2 (two) times daily.   folic acid 1 MG tablet Commonly known as: FOLVITE Take 2 mg by mouth daily.   HYDROcodone-acetaminophen 5-325 MG tablet Commonly known as: NORCO/VICODIN Take 1-2 tablets by  mouth every 4 (four) hours as needed for moderate pain or severe pain.   lidocaine 5 % Commonly known as: LIDODERM Place 1 patch onto the skin daily. Remove & Discard patch within 12 hours or as directed by MD   primidone 250 MG tablet Commonly known as: MYSOLINE Take 250 mg by mouth 2 (two) times daily.   simvastatin 20 MG tablet Commonly known as: ZOCOR Take 20 mg by  mouth at bedtime.   thiamine 100 MG tablet Take 100 mg by mouth daily.   vitamin B-12 1000 MCG tablet Commonly known as: CYANOCOBALAMIN Take 1,000 mcg by mouth daily.      Follow-up Information    Phillips Odor, MD Follow up in 4 week(s).   Specialty: Neurology Contact information: [address] [place] [address] [phone]        pcp at [facility] Follow up in 1 week(s).          No Known Allergies  Consultations:  Neurology   Procedures/Studies: DG Ribs Unilateral W/Chest Right  Result Date: 02/16/2020 CLINICAL DATA:  Fall into clothes rack at [employer] injuring right side. EXAM: RIGHT RIBS AND CHEST - 3+ VIEW COMPARISON:  Chest radiograph 11/21/2019, CT 05/28/2019 FINDINGS: Nondisplaced fractures of anterior right seventh, eighth, and ninth ribs. There is no evidence of pneumothorax or pleural effusion. Both lungs are clear. Heart size and mediastinal contours are within normal limits. Vagal nerve stimulator on the left. IMPRESSION: Nondisplaced anterior right seventh, eighth, and ninth rib fractures. No pulmonary complication such as pneumothorax. Electronically Signed   By: Keith Rake M.D.   On: 02/16/2020 16:40   DG Elbow Complete Right  Result Date: 02/16/2020 CLINICAL DATA:  Recent fall with elbow pain, initial encounter EXAM: RIGHT ELBOW - COMPLETE 3+ VIEW COMPARISON:  None. FINDINGS: There is no evidence of fracture, dislocation, or joint effusion. There is no evidence of arthropathy or other focal bone abnormality. Soft tissues are unremarkable. IMPRESSION: No acute abnormality noted. Electronically Signed   By: Inez Catalina M.D.   On: 02/16/2020 18:51   CT Head Wo Contrast  Result Date: 02/16/2020 CLINICAL DATA:  Posttraumatic headache. Fall at [employer] striking head on clothes rack. EXAM: CT HEAD WITHOUT CONTRAST TECHNIQUE: Contiguous axial images were obtained from the base of the skull through the vertex without intravenous contrast. COMPARISON:  Head  CT 04/09/2018 FINDINGS: Brain: No intracranial hemorrhage, mass effect, or midline shift. Stable degree of atrophy that is mildly advanced for age. Mild chronic small vessel ischemia. No hydrocephalus. The basilar cisterns are patent. No evidence of territorial infarct or acute ischemia. No extra-axial or intracranial fluid collection. Vascular: Atherosclerosis of skullbase vasculature without hyperdense vessel or abnormal calcification. Skull: No fracture or focal lesion. Sinuses/Orbits: No acute findings. Chronic mucous retention cyst/mucosal thickening in left side of sphenoid sinus. Other: None. IMPRESSION: 1. No acute intracranial abnormality. No skull fracture. 2. Stable atrophy and chronic small vessel ischemia. Electronically Signed   By: Keith Rake M.D.   On: 02/16/2020 18:48   CT Chest Wo Contrast  Result Date: 02/16/2020 CLINICAL DATA:  Recent fall with right-sided chest pain and rib fractures on recent plain film EXAM: CT CHEST WITHOUT CONTRAST TECHNIQUE: Multidetector CT imaging of the chest was performed following the standard protocol without IV contrast. COMPARISON:  Plain films from earlier in the same day, CT from 05/28/2019. FINDINGS: Cardiovascular: Somewhat limited due to lack of IV contrast. Atherosclerotic calcifications are seen. No significant aneurysmal dilatation is noted. Coronary calcifications are seen.  No cardiac enlargement is noted. The pulmonary artery shows a normal size. Mediastinum/Nodes: Thoracic inlet is within normal limits. No hilar or mediastinal adenopathy is noted. No mediastinal hematoma is seen. Lungs/Pleura: Scattered calcified granulomas are noted within the lungs. The lungs are well aerated without focal infiltrate or sizable effusion. No pneumothorax is seen. Minimal emphysematous changes are noted. Upper Abdomen: Visualized upper abdomen is within normal limits. Musculoskeletal: No compression deformities are seen. No sternal fracture is noted. Mildly  displaced fractures of the sixth through ninth ribs on the right are noted similar to that seen on prior plain film examination. No left-sided rib fractures are seen. IMPRESSION: Multiple right-sided rib fractures without complicating factors. Changes of prior granulomatous disease. Aortic Atherosclerosis (ICD10-I70.0) and Emphysema (ICD10-J43.9). Electronically Signed   By: Alcide CleverMark  Lukens M.D.   On: 02/16/2020 18:50   CT ABDOMEN PELVIS W CONTRAST  Result Date: 02/16/2020 CLINICAL DATA:  Acute pain due to trauma. EXAM: CT ABDOMEN AND PELVIS WITH CONTRAST TECHNIQUE: Multidetector CT imaging of the abdomen and pelvis was performed using the standard protocol following bolus administration of intravenous contrast. CONTRAST:  100mL OMNIPAQUE IOHEXOL 300 MG/ML  SOLN COMPARISON:  None. FINDINGS: Lower chest: The lung bases are clear. The heart size is normal. Hepatobiliary: The liver is normal. Normal gallbladder.There is no biliary ductal dilation. Pancreas: Normal contours without ductal dilatation. No peripancreatic fluid collection. Spleen: No splenic laceration or hematoma. Adrenals/Urinary Tract: --Adrenal glands: No adrenal hemorrhage. --Right kidney/ureter: No hydronephrosis or perinephric hematoma. --Left kidney/ureter: No hydronephrosis or perinephric hematoma. --Urinary bladder: The bladder wall is thickened which is felt to be secondary to underdistention. Stomach/Bowel: --Stomach/Duodenum: No hiatal hernia or other gastric abnormality. Normal duodenal course and caliber. --Small bowel: No dilatation or inflammation. --Colon: No focal abnormality. --Appendix: Normal. Vascular/Lymphatic: Atherosclerotic calcification is present within the non-aneurysmal abdominal aorta, without hemodynamically significant stenosis. --No retroperitoneal lymphadenopathy. --No mesenteric lymphadenopathy. --No pelvic or inguinal lymphadenopathy. Reproductive: Unremarkable Other: There is a trace amount of free fluid in the  patient's pelvis. There is a fat containing umbilical hernia. Musculoskeletal. Acute right-sided rib fractures are noted, better visualized on prior chest CT. There are old healed left-sided rib fractures. There is a subcutaneous contusion overlying the proximal right femur. No large hematoma. IMPRESSION: 1. No acute traumatic abnormality detected within the abdomen or pelvis. 2. Again noted are acute right-sided rib fractures, better visualized on prior chest CT. 3. Fat containing umbilical hernia. 4. Scattered colonic diverticula without CT evidence for diverticulitis. 5.  Aortic Atherosclerosis (ICD10-I70.0). Electronically Signed   By: Katherine Mantlehristopher  Green M.D.   On: 02/16/2020 21:08   EEG adult  Result Date: 02/17/2020 Charlsie QuestYadav, Priyanka O, MD     02/17/2020  6:04 PM Patient Name: Eric [name] MRN: [id number] Epilepsy Attending: Charlsie QuestPriyanka O Yadav Referring Physician/Provider: Dr. Maurilio LovelyPratik Ellana Kawa Date: 02/17/2020 Duration: 27.25 mins Patient history: 64 year old male with history of epilepsy presented with fall.  EEG evaluate for seizures. Level of alertness: Awake AEDs during EEG study: Carbamazepine, valproic acid Technical aspects: This EEG study was done with scalp electrodes positioned according to the 10-20 International system of electrode placement. Electrical activity was acquired at a sampling rate of 500Hz  and reviewed with a high frequency filter of 70Hz  and a low frequency filter of 1Hz . EEG data were recorded continuously and digitally stored. Description: No clear posterior dominant rhythm was seen.  EEG showed continuous generalized 3 to 6 Hz theta-delta slowing.  Physiologic photic driving was not seen during photic stimulation.  Hyperventilation was  not performed. Abnormality - Continuous slow, generalized IMPRESSION: This study is suggestive of mild diffuse encephalopathy, nonspecific to etiology. No seizures or epileptiform discharges were seen throughout the recording. Charlsie Quest      Discharge Exam: Vitals:   02/17/20 1645 02/18/20 0547  BP: 109/67 118/72  Pulse: (!) 52 (!) 49  Resp: 19 17  Temp: 98.7 F (37.1 C) 98.5 F (36.9 C)  SpO2: 100% 97%   Vitals:   02/16/20 2230 02/17/20 0426 02/17/20 1645 02/18/20 0547  BP: 131/73 96/66 109/67 118/72  Pulse: (!) 44 (!) 55 (!) 52 (!) 49  Resp: 16 14 19 17   Temp: 98.7 F (37.1 C) 97.8 F (36.6 C) 98.7 F (37.1 C) 98.5 F (36.9 C)  TempSrc: Oral Oral Oral Oral  SpO2: 100% 98% 100% 97%  Weight:      Height:        General: Pt is alert, awake, not in acute distress Cardiovascular: RRR, S1/S2 +, no rubs, no gallops Respiratory: CTA bilaterally, no wheezing, no rhonchi Abdominal: Soft, NT, ND, bowel sounds + Extremities: no edema, no cyanosis    The results of significant diagnostics from this hospitalization (including imaging, microbiology, ancillary and laboratory) are listed below for reference.     Microbiology: Recent Results (from the past 240 hour(s))  SARS CORONAVIRUS 2 (TAT 6-24 HRS) Nasopharyngeal Nasopharyngeal Swab     Status: None   Collection Time: 02/16/20  8:07 PM   Specimen: Nasopharyngeal Swab  Result Value Ref Range Status   SARS Coronavirus 2 NEGATIVE NEGATIVE Final    Comment: (NOTE) SARS-CoV-2 target nucleic acids are NOT DETECTED. The SARS-CoV-2 RNA is generally detectable in upper and lower respiratory specimens during the acute phase of infection. Negative results do not preclude SARS-CoV-2 infection, do not rule out co-infections with other pathogens, and should not be used as the sole basis for treatment or other patient management decisions. Negative results must be combined with clinical observations, patient history, and epidemiological information. The expected result is Negative. Fact Sheet for Patients: 04/17/20 Fact Sheet for Healthcare Providers: HairSlick.no This test is not yet approved or  cleared by the [place] FDA and  has been authorized for detection and/or diagnosis of SARS-CoV-2 by FDA under an Emergency Use Authorization (EUA). This EUA will remain  in effect (meaning this test can be used) for the duration of the COVID-19 declaration under Section 56 4(b)(1) of the Act, 21 U.S.C. section 360bbb-3(b)(1), unless the authorization is terminated or revoked sooner. Performed at [facility], [address], [place], [place]      Labs: BNP (last 3 results) No results for input(s): BNP in the last 8760 hours. Basic Metabolic Panel: Recent Labs  Lab 02/16/20 1751  NA 140  K 4.4  CL 103  CO2 28  GLUCOSE 101*  BUN 21  CREATININE 0.86  CALCIUM 8.9  MG 2.1  PHOS 3.5   Liver Function Tests: No results for input(s): AST, ALT, ALKPHOS, BILITOT, PROT, ALBUMIN in the last 168 hours. No results for input(s): LIPASE, AMYLASE in the last 168 hours. No results for input(s): AMMONIA in the last 168 hours. CBC: Recent Labs  Lab 02/16/20 1751  WBC 7.8  NEUTROABS 5.6  HGB 12.1*  HCT 38.0*  MCV 105.0*  PLT 167   Cardiac Enzymes: No results for input(s): CKTOTAL, CKMB, CKMBINDEX, TROPONINI in the last 168 hours. BNP: Invalid input(s): POCBNP CBG: Recent Labs  Lab 02/16/20 1749  GLUCAP 115*  D-Dimer No results for input(s): DDIMER in the last 72 hours. Hgb A1c No results for input(s): HGBA1C in the last 72 hours. Lipid Profile No results for input(s): CHOL, HDL, LDLCALC, TRIG, CHOLHDL, LDLDIRECT in the last 72 hours. Thyroid function studies Recent Labs    02/16/20 1751  TSH 0.919   Anemia work up No results for input(s): VITAMINB12, FOLATE, FERRITIN, TIBC, IRON, RETICCTPCT in the last 72 hours. Urinalysis    Component Value Date/Time   COLORURINE YELLOW 05/29/2019 0018   APPEARANCEUR CLEAR 05/29/2019 0018   LABSPEC >1.046 (H) 05/29/2019 0018   PHURINE 6.0 05/29/2019 0018   GLUCOSEU NEGATIVE 05/29/2019 0018   HGBUR  NEGATIVE 05/29/2019 0018   BILIRUBINUR SMALL (A) 05/29/2019 0018   KETONESUR 20 (A) 05/29/2019 0018   PROTEINUR NEGATIVE 05/29/2019 0018   NITRITE NEGATIVE 05/29/2019 0018   LEUKOCYTESUR NEGATIVE 05/29/2019 0018   Sepsis Labs Invalid input(s): PROCALCITONIN,  WBC,  LACTICIDVEN Microbiology Recent Results (from the past 240 hour(s))  SARS CORONAVIRUS 2 (TAT 6-24 HRS) Nasopharyngeal Nasopharyngeal Swab     Status: None   Collection Time: 02/16/20  8:07 PM   Specimen: Nasopharyngeal Swab  Result Value Ref Range Status   SARS Coronavirus 2 NEGATIVE NEGATIVE Final    Comment: (NOTE) SARS-CoV-2 target nucleic acids are NOT DETECTED. The SARS-CoV-2 RNA is generally detectable in upper and lower respiratory specimens during the acute phase of infection. Negative results do not preclude SARS-CoV-2 infection, do not rule out co-infections with other pathogens, and should not be used as the sole basis for treatment or other patient management decisions. Negative results must be combined with clinical observations, patient history, and epidemiological information. The expected result is Negative. Fact Sheet for Patients: HairSlick.no Fact Sheet for Healthcare Providers: quierodirigir.com This test is not yet approved or cleared by the [place] FDA and  has been authorized for detection and/or diagnosis of SARS-CoV-2 by FDA under an Emergency Use Authorization (EUA). This EUA will remain  in effect (meaning this test can be used) for the duration of the COVID-19 declaration under Section 56 4(b)(1) of the Act, 21 U.S.C. section 360bbb-3(b)(1), unless the authorization is terminated or revoked sooner. Performed at [facility], [address], [place], [place] [address]      Time coordinating discharge: 35 minutes  SIGNED:   Erick Blinks, DO [facility] 02/18/2020, 9:54 AM  If 7PM-7AM, please contact  night-coverage [email or web address]

## 2020-02-18 NOTE — Plan of Care (Signed)

## 2020-02-18 NOTE — Evaluation (Signed)
Physical Therapy Evaluation Patient Details Name: Eric [name] MRN: [id number] DOB: [birth year] Today's Date: 02/18/2020   History of Present Illness  Eric [name] is a 64 y.o. male with medical history significant for  Seizures, Atria fibrillation.  Patient presented to the ED with complaints of seizure episode and subsequent fall.  Patient was asked to get today when he had a seizure that was witnessed by his wife.  He fell into a clothing rack hitting the right side of his body.  He tells me the seizure lasted about 2 minutes.He is unable to give me further details about his medications. Patient reports that he missed a dose of his seizure medications about 2 days ago- " the new medication" ,  but he is not sure what the name of it is.  He is also unable to tell me if he has been having frequent seizures and when his last seizure episode was.He denies chest pain, difficulty breathing or dizziness prior to episode today.    Clinical Impression  Patient functioning near baseline for functional mobility and gait, had difficulty sitting up at bedside due to increasing right sided rib cage pain requiring Min guard assist and use of bed rail with head of bed slightly raised (patient has hospital bed at home), demonstrated slightly labored cadence without loss of balance with good return for using SPC.  Patient tolerated sitting up in chair after therapy - nursing staff notified.  Patient will benefit from continued physical therapy in hospital and recommended venue below to increase strength, balance, endurance for safe ADLs and gait.      Follow Up Recommendations Home health PT;Supervision for mobility/OOB;Supervision - Intermittent    Equipment Recommendations  None recommended by PT    Recommendations for Other Services       Precautions / Restrictions Precautions Precautions: Fall Restrictions Weight Bearing Restrictions: No      Mobility  Bed Mobility Overal bed mobility: Needs  Assistance Bed Mobility: Supine to Sit     Supine to sit: Min guard;HOB elevated     General bed mobility comments: slow labored movement, head of bed raised approximately 30 degrees, and use of bed rail  Transfers Overall transfer level: Needs assistance Equipment used: Straight cane Transfers: Sit to/from [employer] Transfers Sit to Stand: Supervision Stand pivot transfers: Supervision       General transfer comment: slightly labored movement mostly due to right sided rib cage soreness  Ambulation/Gait Ambulation/Gait assistance: Supervision Gait Distance (Feet): 70 Feet Assistive device: Straight cane Gait Pattern/deviations: WFL(Within Functional Limits);Step-through pattern;Decreased step length - left;Decreased stance time - right;Decreased stride length Gait velocity: decreased   General Gait Details: slightly labored cadence without loss of balance, limited mostly due to fatigue and right sided rib cage soreness, demonstrates good return for using SPC with mostly 2 point gait pattern  Stairs            Wheelchair Mobility    Modified Rankin (Stroke Patients Only)       Balance Overall balance assessment: Needs assistance Sitting-balance support: Feet supported;No upper extremity supported Sitting balance-Leahy Scale: Good Sitting balance - Comments: seated at EOB   Standing balance support: During functional activity;Single extremity supported Standing balance-Leahy Scale: Fair Standing balance comment: fair/good using SPC                             Pertinent Vitals/Pain Pain Assessment: 0-10 Pain Score: 6  Pain Location: right  side of rib cage Pain Descriptors / Indicators: Sore Pain Intervention(s): Limited activity within patient's tolerance;Monitored during session;Premedicated before session    Home Living Family/patient expects to be discharged to:: Private residence Living Arrangements: Spouse/significant  other Available Help at Discharge: Family;Available 24 hours/day Type of Home: House Home Access: Ramped entrance     Home Layout: One level Home Equipment: Walker - 2 wheels;Cane - single point;Shower seat;Hospital bed      Prior Function Level of Independence: Independent with assistive device(s)         Comments: household and community ambulator with [facility]     Hand Dominance        Extremity/Trunk Assessment   Upper Extremity Assessment Upper Extremity Assessment: Overall WFL for tasks assessed    Lower Extremity Assessment Lower Extremity Assessment: Generalized weakness    Cervical / Trunk Assessment Cervical / Trunk Assessment: Normal  Communication   Communication: No difficulties;HOH;Other (comment)(A little slow to respond to questions)  Cognition Arousal/Alertness: Awake/alert Behavior During Therapy: WFL for tasks assessed/performed Overall Cognitive Status: Within Functional Limits for tasks assessed                                        General Comments      Exercises     Assessment/Plan    PT Assessment Patient needs continued PT services  PT Problem List Decreased strength;Decreased activity tolerance;Decreased balance;Decreased mobility       PT Treatment Interventions Balance training;Gait training;Stair training;Functional mobility training;Therapeutic activities;Therapeutic exercise;Patient/family education    PT Goals (Current goals can be found in the Care Plan section)  Acute Rehab PT Goals Patient Stated Goal: return home with family to assist PT Goal Formulation: With patient Time For Goal Achievement: 02/20/20 Potential to Achieve Goals: Good    Frequency Min 3X/week   Barriers to discharge        Co-evaluation               AM-PAC PT "6 Clicks" Mobility  Outcome Measure Help needed turning from your back to your side while in a flat bed without using bedrails?: A Little Help needed moving from  lying on your back to sitting on the side of a flat bed without using bedrails?: A Little Help needed moving to and from a bed to a chair (including a wheelchair)?: None Help needed standing up from a chair using your arms (e.g., wheelchair or bedside chair)?: None Help needed to walk in hospital room?: A Little Help needed climbing 3-5 steps with a railing? : A Little 6 Click Score: 20    End of Session   Activity Tolerance: Patient tolerated treatment well;Patient limited by fatigue Patient left: in chair Nurse Communication: Mobility status PT Visit Diagnosis: Unsteadiness on feet (R26.81);Other abnormalities of gait and mobility (R26.89);Muscle weakness (generalized) (M62.81)    Time: 4235-3614 PT Time Calculation (min) (ACUTE ONLY): 26 min   Charges:   PT Evaluation $PT Eval Moderate Complexity: 1 Mod PT Treatments $Therapeutic Activity: 23-37 mins        9:15 AM, 02/18/20 Lonell Grandchild, MPT Physical Therapist with [facility] [phone] office [phone] mobile phone

## 2020-02-18 NOTE — Plan of Care (Signed)
  Problem: Acute Rehab PT Goals(only PT should resolve) Goal: Pt Will Go Supine/Side To Sit Outcome: Progressing Flowsheets (Taken 02/18/2020 0917) Pt will go Supine/Side to Sit:  with supervision  with modified independence Goal: Patient Will Transfer Sit To/From Stand Outcome: Progressing Flowsheets (Taken 02/18/2020 0917) Patient will transfer sit to/from stand: with modified independence Goal: Pt Will Transfer Bed To Chair/Chair To Bed Outcome: Progressing Flowsheets (Taken 02/18/2020 0917) Pt will Transfer Bed to Chair/Chair to Bed: with modified independence Goal: Pt Will Ambulate Outcome: Progressing Flowsheets (Taken 02/18/2020 0917) Pt will Ambulate:  > 125 feet  with modified independence  with cane   9:17 AM, 02/18/20 Ocie Bob, MPT Physical Therapist with [facility] 336 [phone] office [phone] mobile phone

## 2020-02-18 NOTE — Progress Notes (Signed)
CSW in contact with patient to follow up with Home health PT orders. Patient states that he is interested in receiving home health and goes into detail and explains that he had home health previously in the past that was set up by the [place]. Family is unaware as to which company provided this service.  CSW offered patient to refer patient to another source but family states that they must go through the [place] so that it can be covered by his [facility] coverage. CSW was agreeable and states that she will follow up with the [facility] regarding his need for home health.  Casper Pagliuca Sherryle Lis LCSWA Transitions of Care  Clinical Social Worker  Ph: [phone]

## 2020-02-21 ENCOUNTER — Other Ambulatory Visit: Payer: Self-pay | Admitting: *Deleted

## 2020-02-21 NOTE — Patient Outreach (Addendum)
[facility]) Care Management  02/21/2020  Eric [name] 06-26-1956 [id number]   EMMI-GENERAL DISCHARGE RED ON EMMI ALERT Day #1 Date: 3/14 Red Alert Reason: NO FOLLOW UP APPOINTMENT   Outreach # 1  RN attempted outreach call however unsuccessful. RN able to leave a HIPAA approved voice message requesting a call back. Will further engage at that time.  Plan: Will follow over the next week with another outreach call and send outreach letter of engagement.  [name], RN Care Management Coordinator [facility] Main Office [phone]

## 2020-02-25 ENCOUNTER — Other Ambulatory Visit: Payer: Self-pay | Admitting: *Deleted

## 2020-02-25 NOTE — Patient Outreach (Signed)
[facility]) Care Management  02/25/2020  [name] 08-25-1956 [id number]    EMMI-GENERAL DISCHARGE RED ON EMMI ALERT Day #1 Date: 02/20/2020 Red Alert Reason: NO FOLLOW UP APPPOINTMENT  Outreach #2: RN spoke with both the pt and permission to speak with his spouse Kathie Rhodes). RN further inquired on the above emmi as spouse states she is awaiting a call back from the [place] to request both a visit and possible PT therapy that has been suggested upon pt's discharge. RN strongly encouraged spouse to continue with the attempts to make an appointment either virtual office visit to alert pt's provider of his recent hospitalization ( spouse receptive to complete this task). No further needs or issues at this time.   Plan: Will close emmi with no additional needs at this time.  [name], RN Care Management Coordinator [facility] Main Office [phone]

## 2020-12-07 ENCOUNTER — Other Ambulatory Visit: Payer: Self-pay

## 2020-12-07 ENCOUNTER — Emergency Department (HOSPITAL_COMMUNITY)
Admission: EM | Admit: 2020-12-07 | Discharge: 2020-12-07 | Discharge status: Against Medical Advice | Payer: Medicare Other

## 2020-12-09 ENCOUNTER — Other Ambulatory Visit: Payer: Self-pay

## 2020-12-09 ENCOUNTER — Emergency Department (HOSPITAL_COMMUNITY)
Admission: EM | Admit: 2020-12-09 | Discharge: 2020-12-10 | Disposition: A | Discharge status: Home / Self Care | Payer: No Typology Code available for payment source | Attending: Emergency Medicine | Admitting: Emergency Medicine

## 2020-12-09 ENCOUNTER — Encounter (HOSPITAL_COMMUNITY): Payer: Self-pay | Admitting: *Deleted

## 2020-12-09 ENCOUNTER — Emergency Department (HOSPITAL_COMMUNITY): Payer: No Typology Code available for payment source

## 2020-12-09 DIAGNOSIS — R059 Cough, unspecified: Secondary | ICD-10-CM | POA: Diagnosis present

## 2020-12-09 DIAGNOSIS — U071 COVID-19: Secondary | ICD-10-CM

## 2020-12-09 DIAGNOSIS — R410 Disorientation, unspecified: Secondary | ICD-10-CM | POA: Insufficient documentation

## 2020-12-09 LAB — CBC WITH DIFFERENTIAL/PLATELET
Abs Immature Granulocytes: 0.04 10*3/uL (ref 0.00–0.07)
Basophils Absolute: 0 10*3/uL (ref 0.0–0.1)
Basophils Relative: 0 %
Eosinophils Absolute: 0 10*3/uL (ref 0.0–0.5)
Eosinophils Relative: 0 %
HCT: 43.6 % (ref 39.0–52.0)
Hemoglobin: 14.5 g/dL (ref 13.0–17.0)
Immature Granulocytes: 1 %
Lymphocytes Relative: 16 %
Lymphs Abs: 1.1 10*3/uL (ref 0.7–4.0)
MCH: 34.4 pg — ABNORMAL HIGH (ref 26.0–34.0)
MCHC: 33.3 g/dL (ref 30.0–36.0)
MCV: 103.6 fL — ABNORMAL HIGH (ref 80.0–100.0)
Monocytes Absolute: 0.9 10*3/uL (ref 0.1–1.0)
Monocytes Relative: 14 %
Neutro Abs: 4.8 10*3/uL (ref 1.7–7.7)
Neutrophils Relative %: 69 %
Platelets: 153 10*3/uL (ref 150–400)
RBC: 4.21 MIL/uL — ABNORMAL LOW (ref 4.22–5.81)
RDW: 14.3 % (ref 11.5–15.5)
WBC: 6.9 10*3/uL (ref 4.0–10.5)
nRBC: 0 % (ref 0.0–0.2)

## 2020-12-09 LAB — COMPREHENSIVE METABOLIC PANEL
ALT: 5 U/L (ref 0–44)
AST: 18 U/L (ref 15–41)
Albumin: 3.5 g/dL (ref 3.5–5.0)
Alkaline Phosphatase: 49 U/L (ref 38–126)
Anion gap: 13 (ref 5–15)
BUN: 26 mg/dL — ABNORMAL HIGH (ref 8–23)
CO2: 26 mmol/L (ref 22–32)
Calcium: 9 mg/dL (ref 8.9–10.3)
Chloride: 98 mmol/L (ref 98–111)
Creatinine, Ser: 1.52 mg/dL — ABNORMAL HIGH (ref 0.61–1.24)
GFR, Estimated: 51 mL/min — ABNORMAL LOW (ref >60)
Glucose, Bld: 104 mg/dL — ABNORMAL HIGH (ref 70–99)
Potassium: 4.3 mmol/L (ref 3.5–5.1)
Sodium: 137 mmol/L (ref 135–145)
Total Bilirubin: 0.4 mg/dL (ref 0.3–1.2)
Total Protein: 7.8 g/dL (ref 6.5–8.1)

## 2020-12-09 LAB — RESP PANEL BY RT-PCR (FLU A&B, COVID) ARPGX2
Influenza A by PCR: NEGATIVE
Influenza B by PCR: NEGATIVE
SARS Coronavirus 2 by RT PCR: POSITIVE — AB

## 2020-12-09 MED ORDER — SODIUM CHLORIDE 0.9 % IV BOLUS
1000.0000 mL | Freq: Once | INTRAVENOUS | Status: AC
  Administered 2020-12-10: 1000 mL via INTRAVENOUS

## 2020-12-09 NOTE — ED Notes (Signed)

## 2020-12-09 NOTE — ED Triage Notes (Signed)
[facility] reports that pt has been exposed to Covid and reported that his family has Covid.  Symptoms gen weakness and decrease in appetite.  Pt confused and does not answer questions in triage.

## 2020-12-09 NOTE — ED Notes (Signed)
Patient transported to CT by radiology staff.

## 2020-12-09 NOTE — ED Provider Notes (Signed)
[facility] EMERGENCY DEPARTMENT Provider Note   CSN: [id number] Arrival date & time: 12/09/20  1807     History Chief Complaint  Patient presents with  . Covid +    Eric [name] is a 65 y.o. male past history of A. fib, hypercholesterolemia, seizures who presents for cough, nasal congestion, rhinorrhea, fevers have been ongoing for about a week.  Wife reports that several members of the family have tested positive for Covid.  He has been around them.  He has not been vaccinated for Covid.  She states that his symptoms have gotten worse.  Cough is sometimes productive of phlegm.  She states that particular at night, he gets real congestion and has trouble sleeping.  She states that today, he was very weak and was having trouble getting up and moving around because he was weak.  She also reports that he was confused.  She states that sometimes he will get confused and sometimes he will have difficulty answering questions.  She denies any vomiting.   The history is provided by the spouse.       Past Medical History:  Diagnosis Date  . A-fib (HCC)   . Hypercholesteremia   . Seizures [facility])     Patient Active Problem List   Diagnosis Date Noted  . Seizure (HCC) 02/16/2020  . Unspecified atrial fibrillation (HCC) 02/16/2020  . Multiple rib fractures 02/16/2020    Past Surgical History:  Procedure Laterality Date  . APPENDECTOMY    . CARDIAC ELECTROPHYSIOLOGY STUDY AND ABLATION    . IMPLANTATION VAGAL NERVE STIMULATOR  03/2017       Family History  Problem Relation Age of Onset  . Heart disease Mother        pacemaker in his 73s  . Heart failure Father   . Stroke Father   . Heart attack Brother        46s    Social History   Tobacco Use  . Smoking status: Never Smoker  . Smokeless tobacco: Never Used  Substance Use Topics  . Alcohol use: No  . Drug use: No    Home Medications Prior to Admission medications   Medication Sig Start Date End Date Taking?  Authorizing Provider  carbamazepine (TEGRETOL) 200 MG tablet Take 1.5 tablets (300 mg total) by mouth 3 (three) times daily. 02/18/20 03/19/20  Sherryll Burger, Pratik D, DO  carbidopa-levodopa (SINEMET IR) 25-100 MG tablet Take 2 tablets by mouth 3 (three) times daily.     [provider]  divalproex (DEPAKOTE) 250 MG DR tablet Take 750 mg by mouth 2 (two) times daily.    [provider]  folic acid (FOLVITE) 1 MG tablet Take 2 mg by mouth daily.     [provider]  HYDROcodone-acetaminophen (NORCO/VICODIN) 5-325 MG tablet Take 1-2 tablets by mouth every 4 (four) hours as needed for moderate pain or severe pain. 02/18/20   Sherryll Burger, Pratik D, DO  lidocaine (LIDODERM) 5 % Place 1 patch onto the skin daily. Remove & Discard patch within 12 hours or as directed by MD 02/18/20   Sherryll Burger, Pratik D, DO  primidone (MYSOLINE) 250 MG tablet Take 250 mg by mouth 2 (two) times daily.    [provider]  simvastatin (ZOCOR) 20 MG tablet Take 20 mg by mouth at bedtime.     [provider]  thiamine 100 MG tablet Take 100 mg by mouth daily.    [provider]  vitamin B-12 (CYANOCOBALAMIN) 1000 MCG tablet Take  1,000 mcg by mouth daily.    [provider]    Allergies    Patient has no known allergies.  Review of Systems   Review of Systems  Constitutional: Positive for activity change, appetite change, chills, fatigue and fever.  Respiratory: Positive for cough. Negative for shortness of breath.   Cardiovascular: Negative for chest pain.  Gastrointestinal: Negative for abdominal pain, nausea and vomiting.  Musculoskeletal: Positive for myalgias.  All other systems reviewed and are negative.   Physical Exam Updated Vital Signs BP 134/90 (BP Location: Right Arm)   Pulse 84   Temp 99.6 F (37.6 C) (Oral)   Resp 14   SpO2 97%   Physical Exam Vitals and nursing note reviewed.  Constitutional:      Appearance: Normal appearance. He is well-developed and  well-nourished.  HENT:     Head: Normocephalic and atraumatic.     Mouth/Throat:     Mouth: Oropharynx is clear and moist and mucous membranes are normal.  Eyes:     General: Lids are normal.     Extraocular Movements: EOM normal.     Conjunctiva/sclera: Conjunctivae normal.     Pupils: Pupils are equal, round, and reactive to light.  Cardiovascular:     Rate and Rhythm: Normal rate and regular rhythm.     Pulses: Normal pulses.     Heart sounds: Normal heart sounds. No murmur heard. No friction rub. No gallop.   Pulmonary:     Effort: Pulmonary effort is normal.     Breath sounds: Normal breath sounds.     Comments: Lungs clear to auscultation bilaterally.  Symmetric chest rise.  No wheezing, rales, rhonchi. No evidence of respiratory distress. Able to speak in full sentences without any difficulty.  Abdominal:     Palpations: Abdomen is soft. Abdomen is not rigid.     Tenderness: There is no abdominal tenderness. There is no guarding.     Comments: Abdomen is soft, non-distended, non-tender. No rigidity, No guarding. No peritoneal signs.  Musculoskeletal:        General: Normal range of motion.     Cervical back: Full passive range of motion without pain.  Skin:    General: Skin is warm and dry.     Capillary Refill: Capillary refill takes less than 2 seconds.  Neurological:     Mental Status: He is alert.     Comments: Alert and oriented x 2  Follows most commands Moves all extremities.   Psychiatric:        Mood and Affect: Mood and affect normal.        Speech: Speech normal.     ED Results / Procedures / Treatments   Labs (all labs ordered are listed, but only abnormal results are displayed) Labs Reviewed  RESP PANEL BY RT-PCR (FLU A&B, COVID) ARPGX2 - Abnormal; Notable for the following components:      Result Value   SARS Coronavirus 2 by RT PCR POSITIVE (*)    All other components within normal limits  COMPREHENSIVE METABOLIC PANEL - Abnormal; Notable for the  following components:   Glucose, Bld 104 (*)    BUN 26 (*)    Creatinine, Ser 1.52 (*)    GFR, Estimated 51 (*)    All other components within normal limits  CBC WITH DIFFERENTIAL/PLATELET - Abnormal; Notable for the following components:   RBC 4.21 (*)    MCV 103.6 (*)    MCH 34.4 (*)    All other  components within normal limits  URINALYSIS, ROUTINE W REFLEX MICROSCOPIC    EKG EKG Interpretation  Date/Time:  Saturday December 09 2020 21:18:52 EST Ventricular Rate:  71 PR Interval:    QRS Duration: 106 QT Interval:  365 QTC Calculation: 400 R Axis:   11 Text Interpretation: remove ekg, too much artifact Confirmed by Zadie Rhine ([id number]) on 12/09/2020 11:43:01 PM   Radiology DG Chest Portable 1 View  Result Date: 12/09/2020 CLINICAL DATA:  COVID exposure EXAM: PORTABLE CHEST 1 VIEW COMPARISON:  02/16/2020 FINDINGS: Stimulator generator over the left chest. Mild bronchitic changes without focal airspace consolidation, pleural effusion or pneumothorax. Emphysematous disease. IMPRESSION: Mild bronchitic changes without focal airspace disease. Electronically Signed   By: Jasmine Pang M.D.   On: 12/09/2020 19:34    Procedures Procedures (including critical care time)  Medications Ordered in ED Medications  sodium chloride 0.9 % bolus 1,000 mL (has no administration in time range)    ED Course  I have reviewed the triage vital signs and the nursing notes.  Pertinent labs & imaging results that were available during my care of the patient were reviewed by me and considered in my medical decision making (see chart for details).    MDM Rules/Calculators/A&P                          65 year old male who presents for evaluation of cough, fever, congestion.  He states it has been ongoing for about a week or so.  He has not been vaccinated for Covid. On Initial arrival, he is afebrile nontoxic-appearing.  Vital signs are stable.  On exam, no evidence of respiratory distress.   Benign abdominal exam.  Plan to check labs, chest x-ray.   I discussed with patient's wife.  She states that the whole family has been sick with Covid.  He has been around them.  He has not been vaccinated.  She states that over the last week, patient has had cough, congestion.  She states he has been more fatigued.  She states that sometimes he will get confused and sometimes he will not be able answer questions.  She states that he sometimes will be more clear than other times.  She felt that patient was little bit more weak and tired today which is what caused her to bring to the emergency department.  He has not been vaccinated.  Chest x-ray shows mild bronchitic changes focal airspace disease.  He is Covid positive.  CBC shows no leukocytosis.  Hemoglobin stable at 14.5.  BUN is 26, and 1.52.  Patient given small amount of fluid.   At this time, patient is resting comfortably.  No signs of respiratory distress.  He is hemodynamically stable.  This time, do not feel that patient warrants admission.  We will plan to have him referred to monoclonal antibody infusion.   Patient signed out to Dr. Bebe Shaggy pending urine and CT scan.   Portions of this note were generated with Scientist, clinical (histocompatibility and immunogenetics). Dictation errors may occur despite best attempts at proofreading.  Eric [name] was evaluated in Emergency Department on 12/09/2020 for the symptoms described in the history of present illness. He was evaluated in the context of the global COVID-19 pandemic, which necessitated consideration that the patient might be at risk for infection with the SARS-CoV-2 virus that causes COVID-19. Institutional protocols and algorithms that pertain to the evaluation of patients at risk for COVID-19 are in a state of rapid  change based on information released by regulatory bodies including the CDC and federal and state organizations. These policies and algorithms were followed during the patient's care in the  ED.  Final Clinical Impression(s) / ED Diagnoses Final diagnoses:  [id number]    Rx / DC Orders ED Discharge Orders    None       Desma Mcgregor 12/09/20 2353    Maudie Flakes, MD 12/09/20 2355

## 2020-12-09 NOTE — Discharge Instructions (Addendum)
You have been referred to medical antibody infusion clinic.  If you are a candidate, they will notify you and discuss with you the possibility.  Return to the emergency department for any difficulty breathing, vomiting.

## 2020-12-10 LAB — URINALYSIS, ROUTINE W REFLEX MICROSCOPIC
Bacteria, UA: NONE SEEN
Bilirubin Urine: NEGATIVE
Glucose, UA: NEGATIVE mg/dL
Hgb urine dipstick: NEGATIVE
Ketones, ur: 20 mg/dL — AB
Leukocytes,Ua: NEGATIVE
Nitrite: NEGATIVE
Protein, ur: 30 mg/dL — AB
Specific Gravity, Urine: 1.026 (ref 1.005–1.030)
pH: 5 (ref 5.0–8.0)

## 2020-12-10 NOTE — ED Provider Notes (Signed)
I assumed care in signout to follow-up on urinalysis & CT imaging.  CT head is negative.  No signs of UTI.  Patient is awake alert at this time.  No hypoxia.  He will be discharged home   Zadie Rhine, MD 12/10/20 0210

## 2020-12-10 NOTE — ED Notes (Signed)
Pt returned from CT via stretcher.

## 2020-12-11 ENCOUNTER — Telehealth: Payer: Self-pay | Admitting: Infectious Diseases

## 2020-12-11 NOTE — Telephone Encounter (Signed)
Called to discuss with patient about COVID-19 symptoms and the use of one of the available treatments for those with mild to moderate Covid symptoms and at a high risk of hospitalization.  Pt appears to qualify for outpatient treatment due to co-morbid conditions and/or a member of an at-risk group in accordance with the FDA Emergency Use Authorization.    Symptom onset: roughly 7d ago per chart review Vaccinated: no Qualifiers: atrial fibrillation, HLD, unvaccinated, BMI 26  Unable to reach pt - LVM for he and his wife regarding referral. mYChart also sent   [employer]

## 2021-02-13 ENCOUNTER — Emergency Department (HOSPITAL_COMMUNITY): Payer: No Typology Code available for payment source

## 2021-02-13 ENCOUNTER — Encounter (HOSPITAL_COMMUNITY): Payer: Self-pay

## 2021-02-13 ENCOUNTER — Emergency Department (HOSPITAL_COMMUNITY)
Admission: EM | Admit: 2021-02-13 | Discharge: 2021-02-15 | Disposition: A | Discharge status: Other Acute Inpatient Hospital | Payer: No Typology Code available for payment source | Attending: Emergency Medicine | Admitting: Emergency Medicine

## 2021-02-13 ENCOUNTER — Other Ambulatory Visit: Payer: Self-pay

## 2021-02-13 ENCOUNTER — Ambulatory Visit (HOSPITAL_COMMUNITY)
Admission: RE | Admit: 2021-02-13 | Discharge: 2021-02-13 | Disposition: A | Discharge status: Home / Self Care | Payer: Medicare Other | Attending: Psychiatry | Admitting: Psychiatry

## 2021-02-13 DIAGNOSIS — R45851 Suicidal ideations: Secondary | ICD-10-CM | POA: Insufficient documentation

## 2021-02-13 DIAGNOSIS — Z046 Encounter for general psychiatric examination, requested by authority: Secondary | ICD-10-CM

## 2021-02-13 DIAGNOSIS — Z7901 Long term (current) use of anticoagulants: Secondary | ICD-10-CM | POA: Diagnosis not present

## 2021-02-13 DIAGNOSIS — F4321 Adjustment disorder with depressed mood: Secondary | ICD-10-CM | POA: Diagnosis not present

## 2021-02-13 DIAGNOSIS — Z20822 Contact with and (suspected) exposure to covid-19: Secondary | ICD-10-CM | POA: Diagnosis not present

## 2021-02-13 DIAGNOSIS — F333 Major depressive disorder, recurrent, severe with psychotic symptoms: Secondary | ICD-10-CM | POA: Insufficient documentation

## 2021-02-13 DIAGNOSIS — R456 Violent behavior: Secondary | ICD-10-CM | POA: Insufficient documentation

## 2021-02-13 DIAGNOSIS — R000 Tachycardia, unspecified: Secondary | ICD-10-CM | POA: Diagnosis not present

## 2021-02-13 DIAGNOSIS — R259 Unspecified abnormal involuntary movements: Secondary | ICD-10-CM | POA: Insufficient documentation

## 2021-02-13 LAB — CBC WITH DIFFERENTIAL/PLATELET
Abs Immature Granulocytes: 0.01 10*3/uL (ref 0.00–0.07)
Basophils Absolute: 0 10*3/uL (ref 0.0–0.1)
Basophils Relative: 0 %
Eosinophils Absolute: 0 10*3/uL (ref 0.0–0.5)
Eosinophils Relative: 0 %
HCT: 38.7 % — ABNORMAL LOW (ref 39.0–52.0)
Hemoglobin: 12.8 g/dL — ABNORMAL LOW (ref 13.0–17.0)
Immature Granulocytes: 0 %
Lymphocytes Relative: 20 %
Lymphs Abs: 1.5 10*3/uL (ref 0.7–4.0)
MCH: 34 pg (ref 26.0–34.0)
MCHC: 33.1 g/dL (ref 30.0–36.0)
MCV: 102.9 fL — ABNORMAL HIGH (ref 80.0–100.0)
Monocytes Absolute: 1 10*3/uL (ref 0.1–1.0)
Monocytes Relative: 14 %
Neutro Abs: 4.6 10*3/uL (ref 1.7–7.7)
Neutrophils Relative %: 66 %
Platelets: 194 10*3/uL (ref 150–400)
RBC: 3.76 MIL/uL — ABNORMAL LOW (ref 4.22–5.81)
RDW: 13.9 % (ref 11.5–15.5)
WBC: 7.1 10*3/uL (ref 4.0–10.5)
nRBC: 0 % (ref 0.0–0.2)

## 2021-02-13 LAB — COMPREHENSIVE METABOLIC PANEL
ALT: 12 U/L (ref 0–44)
AST: 19 U/L (ref 15–41)
Albumin: 3.5 g/dL (ref 3.5–5.0)
Alkaline Phosphatase: 50 U/L (ref 38–126)
Anion gap: 11 (ref 5–15)
BUN: 11 mg/dL (ref 8–23)
CO2: 27 mmol/L (ref 22–32)
Calcium: 9.5 mg/dL (ref 8.9–10.3)
Chloride: 101 mmol/L (ref 98–111)
Creatinine, Ser: 0.89 mg/dL (ref 0.61–1.24)
GFR, Estimated: >60 mL/min (ref >60)
Glucose, Bld: 124 mg/dL — ABNORMAL HIGH (ref 70–99)
Potassium: 4.9 mmol/L (ref 3.5–5.1)
Sodium: 139 mmol/L (ref 135–145)
Total Bilirubin: 0.7 mg/dL (ref 0.3–1.2)
Total Protein: 7.6 g/dL (ref 6.5–8.1)

## 2021-02-13 LAB — ETHANOL: Alcohol, Ethyl (B): <10 mg/dL (ref <10)

## 2021-02-13 LAB — SALICYLATE LEVEL: Salicylate Lvl: <7 mg/dL — ABNORMAL LOW (ref 7.0–30.0)

## 2021-02-13 LAB — ACETAMINOPHEN LEVEL: Acetaminophen (Tylenol), Serum: <10 ug/mL — ABNORMAL LOW (ref 10–30)

## 2021-02-13 MED ORDER — SIMVASTATIN 20 MG PO TABS
20.0000 mg | ORAL_TABLET | Freq: Every day | ORAL | Status: DC
  Administered 2021-02-13 – 2021-02-14 (×2): 20 mg via ORAL
  Filled 2021-02-13 (×2): qty 1

## 2021-02-13 MED ORDER — DIVALPROEX SODIUM 250 MG PO DR TAB
750.0000 mg | DELAYED_RELEASE_TABLET | Freq: Two times a day (BID) | ORAL | Status: DC
  Administered 2021-02-13 – 2021-02-15 (×4): 750 mg via ORAL
  Filled 2021-02-13 (×4): qty 1

## 2021-02-13 MED ORDER — ALBUTEROL SULFATE HFA 108 (90 BASE) MCG/ACT IN AERS: 2.0000 | INHALATION_SPRAY | Freq: Four times a day (QID) | RESPIRATORY_TRACT | Status: DC | PRN

## 2021-02-13 MED ORDER — CARBIDOPA-LEVODOPA 25-100 MG PO TABS
2.0000 | ORAL_TABLET | Freq: Three times a day (TID) | ORAL | Status: DC
  Administered 2021-02-13 – 2021-02-15 (×5): 2 via ORAL
  Filled 2021-02-13 (×6): qty 2

## 2021-02-13 MED ORDER — CARBAMAZEPINE 200 MG PO TABS
300.0000 mg | ORAL_TABLET | Freq: Three times a day (TID) | ORAL | Status: DC
  Administered 2021-02-13 – 2021-02-15 (×5): 300 mg via ORAL
  Filled 2021-02-13 (×6): qty 1.5

## 2021-02-13 MED ORDER — PRIMIDONE 250 MG PO TABS
250.0000 mg | ORAL_TABLET | Freq: Two times a day (BID) | ORAL | Status: DC
  Administered 2021-02-13 – 2021-02-15 (×4): 250 mg via ORAL
  Filled 2021-02-13 (×4): qty 1

## 2021-02-13 MED ORDER — LORAZEPAM 1 MG PO TABS
1.0000 mg | ORAL_TABLET | Freq: Once | ORAL | Status: AC
  Administered 2021-02-13: 1 mg via ORAL
  Filled 2021-02-13: qty 1
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
## 2021-02-13 NOTE — Care Management (Signed)
° °  Writer referred patient to the following facilities:   [facility] Details  Fax        [address] [place] [address]    Internal comment    [facility] Details  Fax        [address] [place], [place] [address]    Internal comment    [facility] Details  Fax        [address] [place] [address]    Internal comment    [facility] Details  Fax        [address] [place], [place] [address]    Internal comment    [facility] Details  Fax        [address] [place] [address]    Internal comment    [facility] Details  Fax        [address] [place] [address]    Internal comment    [facility] Details  Fax        [address] [place] [address]    Internal comment    [facility] Details  Fax        [address], [place] [address]    Internal comment    [facility] Details  Fax        [address] [place] [address]    Internal comment    [facility] Details  Fax        [address] [place] [address]    Internal comment    [facility] Office Details  Fax        [address] [place] [address]    Internal comment    [facility] Details  Fax        [address] [place] [address]    Internal comment
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
## 2021-02-13 NOTE — ED Notes (Addendum)
Pt is change out, cloths, shoe and medication are in locker 30, be has 2 pt bags and one medication bag . Pt also have  CANE.
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
## 2021-02-13 NOTE — ED Notes (Signed)
Per [facility], Left house today stating her was going to kill himself-needs geri-psych placement
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
## 2021-02-14 ENCOUNTER — Encounter (HOSPITAL_COMMUNITY): Payer: Self-pay | Admitting: Registered Nurse

## 2021-02-14 ENCOUNTER — Emergency Department (HOSPITAL_COMMUNITY): Payer: No Typology Code available for payment source

## 2021-02-14 DIAGNOSIS — Z046 Encounter for general psychiatric examination, requested by authority: Secondary | ICD-10-CM | POA: Diagnosis not present

## 2021-02-14 DIAGNOSIS — F4321 Adjustment disorder with depressed mood: Secondary | ICD-10-CM | POA: Diagnosis present

## 2021-02-14 DIAGNOSIS — R45851 Suicidal ideations: Secondary | ICD-10-CM | POA: Diagnosis not present

## 2021-02-14 LAB — URINALYSIS, ROUTINE W REFLEX MICROSCOPIC
Bilirubin Urine: NEGATIVE
Glucose, UA: NEGATIVE mg/dL
Hgb urine dipstick: NEGATIVE
Ketones, ur: 5 mg/dL — AB
Leukocytes,Ua: NEGATIVE
Nitrite: NEGATIVE
Protein, ur: NEGATIVE mg/dL
Specific Gravity, Urine: 1.018 (ref 1.005–1.030)
pH: 7 (ref 5.0–8.0)

## 2021-02-14 LAB — RESP PANEL BY RT-PCR (FLU A&B, COVID) ARPGX2
Influenza A by PCR: NEGATIVE
Influenza B by PCR: NEGATIVE
SARS Coronavirus 2 by RT PCR: NEGATIVE

## 2021-02-14 LAB — RAPID URINE DRUG SCREEN, HOSP PERFORMED
Amphetamines: NOT DETECTED
Barbiturates: POSITIVE — AB
Benzodiazepines: NOT DETECTED
Cocaine: NOT DETECTED
Opiates: NOT DETECTED
Tetrahydrocannabinol: NOT DETECTED
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
## 2021-02-14 NOTE — BH Assessment (Addendum)
[facility] Assessment Progress Note  Per Marciano Sequin, NP, this pt requires psychiatric hospitalization at this time.  Pt presents under IVC initiated by EDP Bethann Berkshire, MD.  The following facilities have been contacted to seek placement for this pt, with results as noted:  Beds available or bed status unknown, information sent, decision pending: [facility] [place] [facility] [place]) [facility]  Unable to reach: Earlene Plater (left message at 10:58)  Declined: [facility] (Parkinson's is exclusionary)  At capacity: [facility] (geriatric unit currently closed) [facility]   Doylene Canning, [place] [employer] Health Coordinator [phone]

## 2021-02-14 NOTE — Progress Notes (Addendum)
ADDENDUM   CSW was not able to reach [facility] admissions for accepting information after multiple attempts. CSW left VM with contact information and callback request. CSW will continue to follow.  Signed:  Corky Crafts, MSW, [place], LCASA 02/14/2021 7:04 PM    Disposition CSW faxed requested documentation to [facility] for admissions consideration. Situation onging, CSW will follow up by phone to get accepting information. Per Iva Lento' note.   Signed:  Corky Crafts, MSW, [place], LCASA 02/14/2021 5:11 PM

## 2021-02-14 NOTE — BH Assessment (Addendum)
[facility] Assessment Progress Note  Per Marciano Sequin, NP this involuntary patient requires psychiatric hospitalization.  Pt has VA benefits.  At 10:44 this Clinical research associate called the [facility] and spoke to [place].  She reports that all four [address] ([place], [place], [place], [place]) are currently under mental health diversion.  Given that pt is under IVC, out-of-state referrals cannot be pursued.  Will seek placement at other facilities as indicated in my other note from this date.  Doylene Canning, [place] Behavioral Health Coordinator [phone]

## 2021-02-14 NOTE — BH Assessment (Addendum)
[facility] Assessment Progress Note  This Clinical research associate received a call from [place] at [facility].  They have tentatively agreed to accept pt to their facility and are holding a bed for him.  However, this is pending receipt and review of chest x-ray, UA, UDS and Covid-19 test.  I have already faxed chest x-ray.  Results of other tests are pending as of this writing.  When results return please fax them to [phone] and follow up with a call to [phone].  Thereafter they will provide the name of an accepting provider, along with phone number for nurse-to-nurse report.  Pt is under IVC and is to be transported via [facility].  Doylene Canning, [place] Behavioral Health Coordinator [phone]  Addendum:  Requested test results have been received and I have faxed them to [facility].  Final decision is pending as of this writing.  Doylene Canning, [place] Behavioral Health Coordinator [phone]

## 2021-02-15 NOTE — ED Provider Notes (Signed)
This patient has been medically cleared and is needing inpatient psychiatric placement.  He has been accepted to an outside hospital psychiatric facility.  He is evaluated prior to discharge and is stable for transfer.   Sabino Donovan, MD 02/15/21 [phone]
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
## 2022-04-19 ENCOUNTER — Encounter (HOSPITAL_COMMUNITY): Payer: Self-pay | Admitting: Emergency Medicine

## 2022-04-19 ENCOUNTER — Other Ambulatory Visit: Payer: Self-pay

## 2022-04-19 ENCOUNTER — Emergency Department (HOSPITAL_COMMUNITY)
Admission: EM | Admit: 2022-04-19 | Discharge: 2022-04-20 | Disposition: A | Discharge status: Home / Self Care | Payer: No Typology Code available for payment source | Attending: Emergency Medicine | Admitting: Emergency Medicine

## 2022-04-19 DIAGNOSIS — F331 Major depressive disorder, recurrent, moderate: Secondary | ICD-10-CM | POA: Insufficient documentation

## 2022-04-19 DIAGNOSIS — F99 Mental disorder, not otherwise specified: Secondary | ICD-10-CM

## 2022-04-19 DIAGNOSIS — F4310 Post-traumatic stress disorder, unspecified: Secondary | ICD-10-CM | POA: Insufficient documentation

## 2022-04-19 DIAGNOSIS — Z7901 Long term (current) use of anticoagulants: Secondary | ICD-10-CM | POA: Diagnosis not present

## 2022-04-19 DIAGNOSIS — R45851 Suicidal ideations: Secondary | ICD-10-CM | POA: Insufficient documentation

## 2022-04-19 DIAGNOSIS — G20 Parkinson's disease: Secondary | ICD-10-CM | POA: Diagnosis not present

## 2022-04-19 DIAGNOSIS — F4321 Adjustment disorder with depressed mood: Secondary | ICD-10-CM | POA: Diagnosis not present

## 2022-04-19 DIAGNOSIS — R461 Bizarre personal appearance: Secondary | ICD-10-CM | POA: Diagnosis present

## 2022-04-19 HISTORY — DX: Parkinson's disease: G20

## 2022-04-19 HISTORY — DX: Parkinson's disease without dyskinesia, without mention of fluctuations: G20.A1

## 2022-04-19 LAB — CBC WITH DIFFERENTIAL/PLATELET
Abs Immature Granulocytes: 0.02 10*3/uL (ref 0.00–0.07)
Basophils Absolute: 0 10*3/uL (ref 0.0–0.1)
Basophils Relative: 1 %
Eosinophils Absolute: 0 10*3/uL (ref 0.0–0.5)
Eosinophils Relative: 1 %
HCT: 36.9 % — ABNORMAL LOW (ref 39.0–52.0)
Hemoglobin: 11.9 g/dL — ABNORMAL LOW (ref 13.0–17.0)
Immature Granulocytes: 0 %
Lymphocytes Relative: 41 %
Lymphs Abs: 2.2 10*3/uL (ref 0.7–4.0)
MCH: 33.1 pg (ref 26.0–34.0)
MCHC: 32.2 g/dL (ref 30.0–36.0)
MCV: 102.5 fL — ABNORMAL HIGH (ref 80.0–100.0)
Monocytes Absolute: 0.5 10*3/uL (ref 0.1–1.0)
Monocytes Relative: 10 %
Neutro Abs: 2.5 10*3/uL (ref 1.7–7.7)
Neutrophils Relative %: 47 %
Platelets: 162 10*3/uL (ref 150–400)
RBC: 3.6 MIL/uL — ABNORMAL LOW (ref 4.22–5.81)
RDW: 12.2 % (ref 11.5–15.5)
WBC: 5.3 10*3/uL (ref 4.0–10.5)
nRBC: 0 % (ref 0.0–0.2)

## 2022-04-19 LAB — BASIC METABOLIC PANEL
Anion gap: 4 — ABNORMAL LOW (ref 5–15)
BUN: 29 mg/dL — ABNORMAL HIGH (ref 8–23)
CO2: 32 mmol/L (ref 22–32)
Calcium: 8.5 mg/dL — ABNORMAL LOW (ref 8.9–10.3)
Chloride: 106 mmol/L (ref 98–111)
Creatinine, Ser: 0.88 mg/dL (ref 0.61–1.24)
GFR, Estimated: >60 mL/min (ref >60)
Glucose, Bld: 108 mg/dL — ABNORMAL HIGH (ref 70–99)
Potassium: 4.8 mmol/L (ref 3.5–5.1)
Sodium: 142 mmol/L (ref 135–145)

## 2022-04-19 LAB — ETHANOL: Alcohol, Ethyl (B): <10 mg/dL (ref <10)

## 2022-04-19 LAB — CARBAMAZEPINE LEVEL, TOTAL: Carbamazepine Lvl: 4.8 ug/mL (ref 4.0–12.0)

## 2022-04-19 LAB — VALPROIC ACID LEVEL: Valproic Acid Lvl: 76 ug/mL (ref 50.0–100.0)

## 2022-04-19 MED ORDER — DIVALPROEX SODIUM 250 MG PO DR TAB
250.0000 mg | DELAYED_RELEASE_TABLET | Freq: Two times a day (BID) | ORAL | Status: DC
  Administered 2022-04-19: 250 mg via ORAL
  Filled 2022-04-19: qty 1

## 2022-04-19 MED ORDER — CARBAMAZEPINE 200 MG PO TABS
300.0000 mg | ORAL_TABLET | Freq: Three times a day (TID) | ORAL | Status: DC
  Administered 2022-04-20 (×2): 300 mg via ORAL
  Filled 2022-04-19: qty 2
  Filled 2022-04-19 (×5): qty 1.5
  Filled 2022-04-19: qty 2
  Filled 2022-04-19: qty 1.5
  Filled 2022-04-19: qty 2
  Filled 2022-04-19 (×2): qty 1.5

## 2022-04-19 MED ORDER — DIVALPROEX SODIUM 250 MG PO DR TAB
750.0000 mg | DELAYED_RELEASE_TABLET | Freq: Two times a day (BID) | ORAL | Status: DC
  Administered 2022-04-20: 500 mg via ORAL
  Administered 2022-04-20: 750 mg via ORAL
  Filled 2022-04-19 (×2): qty 3

## 2022-04-19 MED ORDER — ACETAMINOPHEN 325 MG PO TABS: 650.0000 mg | ORAL_TABLET | ORAL | Status: DC | PRN

## 2022-04-19 MED ORDER — PRIMIDONE 50 MG PO TABS
250.0000 mg | ORAL_TABLET | Freq: Two times a day (BID) | ORAL | Status: DC
  Administered 2022-04-20 (×2): 250 mg via ORAL
  Filled 2022-04-19 (×2): qty 5

## 2022-04-19 MED ORDER — SIMVASTATIN 10 MG PO TABS
20.0000 mg | ORAL_TABLET | Freq: Every day | ORAL | Status: DC
  Administered 2022-04-20: 20 mg via ORAL
  Filled 2022-04-19: qty 2

## 2022-04-19 NOTE — ED Notes (Signed)
Pt getting TTS 

## 2022-04-19 NOTE — ED Notes (Signed)
Spoke to pt's niece regarding pt's disposition. Updated niece that a decision has not been made and pt is getting TTS at the moment.  ?

## 2022-04-19 NOTE — ED Notes (Signed)
Pt ambulated to the bathroom unassisted. Specimen cup provide for a urine sample ?
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
## 2022-04-19 NOTE — ED Notes (Signed)
Pt states HR runs low 50s ?
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
## 2022-04-19 NOTE — ED Notes (Signed)
Gave pt water per verbal okay with RN. ?

## 2022-04-20 LAB — RAPID URINE DRUG SCREEN, HOSP PERFORMED
Amphetamines: NOT DETECTED
Barbiturates: POSITIVE — AB
Benzodiazepines: NOT DETECTED
Cocaine: NOT DETECTED
Opiates: NOT DETECTED
Tetrahydrocannabinol: NOT DETECTED

## 2022-04-20 NOTE — Discharge Instructions (Signed)
Follow-up with the recommendations provided by the behavioral health team ?

## 2022-04-20 NOTE — ED Notes (Signed)
TTS at this time. 

## 2022-04-20 NOTE — ED Notes (Signed)
[name] contacted concerning pt's discharge, stated she will be here within the hour.  ?

## 2022-04-20 NOTE — ED Notes (Signed)
Tray given to pt.

## 2022-04-20 NOTE — BH Assessment (Signed)
This Clinical research associate spoke with patient's niece (caregiver) [name] [phone] and informed her of patient's disposition (patient has been cleared by psychiatry). Niece states that she resides in patient's home and after speaking to other family members patient will eventually relocate to a assisted living facility which patient prior to this event agreed too. Niece voiced no safety concerns and states that she is having a male friend temporarily stay at that reside also until placement for patient can be investigated. Caregiver also reports that male friend will also assist with transporting patient back to his residence this date. Caregiver is requesting that staff contact her about an hour before patient is discharged to assist with transportation.  ?

## 2022-04-20 NOTE — ED Notes (Signed)
Pt's niece updated on pt's disposition ?

## 2022-04-20 NOTE — ED Provider Notes (Signed)
Emergency Medicine Observation Re-evaluation Note ? ?Eric [name] is a 66 y.o. male, seen on rounds today.  Pt initially presented to the ED for complaints of V70.1 ?Currently, the patient is resting. ? ?Physical Exam  ?BP 117/73 (BP Location: Left Arm)   Pulse (!) 44   Temp 97.8 ?F (36.6 ?C)   Resp 12   Ht 1.778 m (5\' 10" )   Wt 72.6 kg   SpO2 100%   BMI 22.97 kg/m?  ?Physical Exam ?General: calm ?Cardiac: bradycardia  ?Lungs: breathing easily ?Psych: calm ? ?ED Course / MDM  ?EKG:  ? ?I have reviewed the labs performed to date as well as medications administered while in observation.  Recent changes in the last 24 hours include initial assessment with recommendations for overnight observation. ? ?Plan  ?Current plan is for reassessment by psychiatry today to determine disposition. ? Eric [name] is not under involuntary commitment. ? ? ?  ?Albertine Patricia, MD ?04/20/22 0720 ? ?

## 2022-04-20 NOTE — Consult Note (Signed)
Telepsych Consultation  ? ?Reason for Consult:  Psychiatric Evaluation ?Referring Physician:  Roselyn Bering, NP ?Location of Patient: [facility] ?Location of Provider: [facility] ? ?Patient Identification: Albertine Patricia ?MRN:  [id number] ?Principal Diagnosis: <General Psychiatric Examination> ?Diagnosis:  Active Problems: ?General Psychiatric Examination ?Psychiatric Disturbances ?Suicidal ideation ?Involuntary Commitment ?Adjustment disorder with depressed mood ? ?Total Time spent with patient: 45 minutes ? ?Subjective:   ?NAVI ERBER is a 66 y.o. male with a past psychiatric history significant for psychiatric disturbance, past suicidal ideations, and adjustment disorder with depressed mood who currently resides at [facility] ED after being admitted for general psychiatric examination.  Patient was admitted by his niece [name], [phone]). ? ?Patient reports that they are doing okay.  When asked the reason for presenting to [facility] ED, patient reported that his niece brought him over.  He states that his niece was supposed to be taking care of his wife who recently passed away.  After the passing of his wife, patient states that he did not know that his niece was continuing to take care of him since he was able to do things on his own.  Patient explains that the reason for the visit today is due to being blamed for things that are going on in the house.  Patient states that he wants a he is not doing these things but is unsure if he is the cause of these issues.  He reports that his niece has put surveillance cameras in and around the house in order to keep tabs on the patient.  Patient recalls seeing mysterious footage part by one of the cameras.  In the footage, patient states that he saw a white woman on Mondays with a black man standing over her along with a red [place].  He reports that his niece also witnessed the same evidence presented in footage. ? ?Patient reports  that his niece is very religious and has been putting crosses around the house and a anointing the crosses with oils.  Patient states that his niece also placed a cross on him.  Patient believes that his current place of residence is haunted.  He states that his son also informed him that there was a man, a woman, and a dog in the house that he is currently staying in. ? ?When asked about his relationship with his niece, patient states that his niece is irritated with him.  He reports that things have been happening around the house and the only other person that could be doing those things if either him or his niece.  He reports that his niece's son used to live with him but has since moved out to [place].  Patient reports that his niece has been acting strangely.  He states that he and his niece went to go visit her boyfriend in prison.  Before reaching the present, the patient and his niece got a motel room.  While the patient's niece was getting ready, she asked if she could look through the patient's belongings.  When she looked through the patient's belongings, patient found her missing male accessories in his bags.  Patient reports that he never stole her items and states that he never had access to their motel room to be able to take her belongings.  Patient informed his niece that it could have been a Radio broadcast assistant or staff member that took her belongings and put them in his luggage. ? ?Patient endorses a past history of  hospitalization due to mental health.  Patient also endorses past suicide attempt that occurred years ago.  The trigger to patient's past suicide attempt was due to things going on in his life and people putting the blame on him for things that he did or did not do.  Patient denies a past history of self-harm.  Patient denies depressive symptoms nor does he endorse anxiety.  Patient was tearful when talking about his recently deceased wife.  Patient denies suicidal or homicidal ideations.  He  further denies auditory or visual hallucinations and does not appear to be responding to internal/external stimuli.  Patient still continues to believe that goes hot his current place of residence.  Patient endorses good sleep and receives on average 6 to 7 hours of sleep each night.  Patient endorses fair appetite stating that he eats what he can get.  Patient denies alcohol consumption, tobacco use, and illicit drug use. ? ?Patient denies being a danger to himself and is able to contract for safety.  Patient's information was documented and disclosed to patient's nurse and EDP.  After discussing the patient with Iona Coach, RN, the nurse informed provider that the patient's niece was okay with taking him home after discharge.  Nurse expressed to provider that patient's niece did express some apprehensiveness in regards to the patient due to patient reports daughter in the past, per niece.  Nurse informed provider that patient's niece is slightly freaked out that the patient may be becoming obsessed with her due to its past history. ? ? ?HPI:   ? ?JARAMIE BASTOS is a 66 y.o. male with a past psychiatric history significant for psychiatric disturbance, past suicidal ideations, and adjustment disorder with depressed mood who currently resides at [facility] ED after being admitted for general psychiatric examination.  Patient was assessed via telepsychiatry. ? ?Patient presents to [facility] ED by way of his niece for general psychiatric examination.  Patient's niece has been caring for the patient since the patient's wife passing.  Patient has been taken care of by his niece for the past 6 months.  Per niece, patient presents to [facility] ED due to being off his Thorazine.  She reports that the patient has been stealing her undergarments as well as pictures of her and her bathing suit. ? ?Patient denies depression nor does he endorse anxiety.  Patient denies suicidal or homicidal ideations.  He further denies  auditory or visual hallucinations and does not appear to be responding to internal/external stimuli.  Patient endorses good sleep and receives on average 6 to 7 hours of sleep each night.  Patient endorses fair appetite stating that he eats what he can get.  Patient denies alcohol consumption, tobacco use, and illicit drug use.  Patient denies being a danger to himself and is able to contract for safety following the conclusion of the encounter.  After the encounter, provider was able to run the patient by his nurse Bonita Quin Long) as well as the EDP (Dr. Lynelle Doctor). ? ?Alfredia Ferguson, LCAS was able to receive collateral from the patient's niece: ? ?~Spoke to niece and will put a note in. Niece does not have any issues with him returning home stating that she has researched several assisted living facilities in the last couple days and she reports that she is going to assist patient with the admission process to move forward with placement. She reports that patient has agreed and actually discussed prior to this last admission. She also reports  that she is having a male friend present with her to assist with transporting patient back home and he will also stay at that residence to ensure caregivers safety.  No further concerns at this time and states she would like to be notified about an hour before his discharge to assist with transportation.    ? ?Past Psychiatric History:  ?Psychiatric disturbances ?Adjustment disorder with depressed mood ?Past suicidal ideations ?IVC ? ?Risk to Self: No ?Risk to Others: Patient denies being a risk to others.  Patient's niece was okay with patient being discharged from the ED if he was psychiatrically cleared ?Prior Inpatient Therapy: Yes ?Prior Outpatient Therapy: Unknown ? ?Past Medical History:  ?Past Medical History:  ?Diagnosis Date  ? A-fib (HCC)   ? Hypercholesteremia   ? Parkinson's disease (HCC)   ? Seizures (HCC)   ?  ?Past Surgical History:  ?Procedure Laterality Date  ?  APPENDECTOMY    ? CARDIAC ELECTROPHYSIOLOGY STUDY AND ABLATION    ? IMPLANTATION VAGAL NERVE STIMULATOR  03/2017  ? ?Family History:  ?Family History  ?Problem Relation Age of Onset  ? Heart disease Mother   ?

## 2022-04-20 NOTE — ED Notes (Signed)
NP recommends Pt be observed overnight and evaluated by psychiatry in the morning ?

## 2022-04-21 DIAGNOSIS — Z634 Disappearance and death of family member: Secondary | ICD-10-CM | POA: Diagnosis not present

## 2022-04-21 DIAGNOSIS — J984 Other disorders of lung: Secondary | ICD-10-CM | POA: Diagnosis not present

## 2022-04-21 DIAGNOSIS — I4891 Unspecified atrial fibrillation: Secondary | ICD-10-CM | POA: Diagnosis not present

## 2022-04-21 DIAGNOSIS — F32A Depression, unspecified: Secondary | ICD-10-CM | POA: Diagnosis not present

## 2022-04-21 DIAGNOSIS — R918 Other nonspecific abnormal finding of lung field: Secondary | ICD-10-CM | POA: Diagnosis not present

## 2022-04-21 DIAGNOSIS — G20 Parkinson's disease: Secondary | ICD-10-CM | POA: Diagnosis not present

## 2022-04-24 DIAGNOSIS — G20 Parkinson's disease: Secondary | ICD-10-CM | POA: Diagnosis not present

## 2022-07-29 DIAGNOSIS — I959 Hypotension, unspecified: Secondary | ICD-10-CM | POA: Diagnosis not present

## 2022-07-29 DIAGNOSIS — R509 Fever, unspecified: Secondary | ICD-10-CM | POA: Diagnosis not present

## 2022-07-29 DIAGNOSIS — I4510 Unspecified right bundle-branch block: Secondary | ICD-10-CM | POA: Diagnosis not present

## 2022-07-29 DIAGNOSIS — R0602 Shortness of breath: Secondary | ICD-10-CM | POA: Diagnosis not present

## 2022-07-29 DIAGNOSIS — R569 Unspecified convulsions: Secondary | ICD-10-CM | POA: Diagnosis not present

## 2022-07-29 DIAGNOSIS — Z20822 Contact with and (suspected) exposure to covid-19: Secondary | ICD-10-CM | POA: Diagnosis not present

## 2022-07-29 DIAGNOSIS — R000 Tachycardia, unspecified: Secondary | ICD-10-CM | POA: Diagnosis not present

## 2022-07-29 DIAGNOSIS — R109 Unspecified abdominal pain: Secondary | ICD-10-CM | POA: Diagnosis not present

## 2022-07-29 DIAGNOSIS — Z79899 Other long term (current) drug therapy: Secondary | ICD-10-CM | POA: Diagnosis not present

## 2022-09-10 DIAGNOSIS — Z23 Encounter for immunization: Secondary | ICD-10-CM | POA: Diagnosis not present

## 2023-01-12 DIAGNOSIS — Z950 Presence of cardiac pacemaker: Secondary | ICD-10-CM | POA: Diagnosis not present

## 2023-01-12 DIAGNOSIS — Z888 Allergy status to other drugs, medicaments and biological substances status: Secondary | ICD-10-CM | POA: Diagnosis not present

## 2023-01-12 DIAGNOSIS — G459 Transient cerebral ischemic attack, unspecified: Secondary | ICD-10-CM | POA: Diagnosis not present

## 2023-01-12 DIAGNOSIS — G20C Parkinsonism, unspecified: Secondary | ICD-10-CM | POA: Diagnosis not present

## 2023-01-12 DIAGNOSIS — Z4542 Encounter for adjustment and management of neuropacemaker (brain) (peripheral nerve) (spinal cord): Secondary | ICD-10-CM | POA: Diagnosis not present

## 2023-01-12 DIAGNOSIS — G20A1 Parkinson's disease without dyskinesia, without mention of fluctuations: Secondary | ICD-10-CM | POA: Diagnosis not present

## 2023-01-12 DIAGNOSIS — R001 Bradycardia, unspecified: Secondary | ICD-10-CM | POA: Diagnosis not present

## 2023-01-12 DIAGNOSIS — Z7901 Long term (current) use of anticoagulants: Secondary | ICD-10-CM | POA: Diagnosis not present

## 2023-01-12 DIAGNOSIS — Z8781 Personal history of (healed) traumatic fracture: Secondary | ICD-10-CM | POA: Diagnosis not present

## 2023-01-12 DIAGNOSIS — I4892 Unspecified atrial flutter: Secondary | ICD-10-CM | POA: Diagnosis not present

## 2023-01-12 DIAGNOSIS — I4891 Unspecified atrial fibrillation: Secondary | ICD-10-CM | POA: Diagnosis not present

## 2023-01-12 DIAGNOSIS — R569 Unspecified convulsions: Secondary | ICD-10-CM | POA: Diagnosis not present

## 2023-01-12 DIAGNOSIS — R0781 Pleurodynia: Secondary | ICD-10-CM | POA: Diagnosis not present

## 2023-01-12 DIAGNOSIS — Z79899 Other long term (current) drug therapy: Secondary | ICD-10-CM | POA: Diagnosis not present

## 2023-01-23 DIAGNOSIS — R270 Ataxia, unspecified: Secondary | ICD-10-CM | POA: Diagnosis not present

## 2023-01-23 DIAGNOSIS — Z20822 Contact with and (suspected) exposure to covid-19: Secondary | ICD-10-CM | POA: Diagnosis not present

## 2023-01-23 DIAGNOSIS — I4891 Unspecified atrial fibrillation: Secondary | ICD-10-CM | POA: Diagnosis not present

## 2023-01-23 DIAGNOSIS — W19XXXD Unspecified fall, subsequent encounter: Secondary | ICD-10-CM | POA: Diagnosis not present

## 2023-01-23 DIAGNOSIS — M19012 Primary osteoarthritis, left shoulder: Secondary | ICD-10-CM | POA: Diagnosis not present

## 2023-01-23 DIAGNOSIS — Z950 Presence of cardiac pacemaker: Secondary | ICD-10-CM | POA: Diagnosis not present

## 2023-01-23 DIAGNOSIS — G20A1 Parkinson's disease without dyskinesia, without mention of fluctuations: Secondary | ICD-10-CM | POA: Diagnosis not present

## 2023-01-23 DIAGNOSIS — Z791 Long term (current) use of non-steroidal anti-inflammatories (NSAID): Secondary | ICD-10-CM | POA: Diagnosis not present

## 2023-01-23 DIAGNOSIS — R2689 Other abnormalities of gait and mobility: Secondary | ICD-10-CM | POA: Diagnosis not present

## 2023-01-23 DIAGNOSIS — R569 Unspecified convulsions: Secondary | ICD-10-CM | POA: Diagnosis not present

## 2023-01-23 DIAGNOSIS — I959 Hypotension, unspecified: Secondary | ICD-10-CM | POA: Diagnosis not present

## 2023-01-23 DIAGNOSIS — R54 Age-related physical debility: Secondary | ICD-10-CM | POA: Diagnosis not present

## 2023-01-23 DIAGNOSIS — R079 Chest pain, unspecified: Secondary | ICD-10-CM | POA: Diagnosis not present

## 2023-01-23 DIAGNOSIS — R001 Bradycardia, unspecified: Secondary | ICD-10-CM | POA: Diagnosis not present

## 2023-01-23 DIAGNOSIS — R52 Pain, unspecified: Secondary | ICD-10-CM | POA: Diagnosis not present

## 2023-01-23 DIAGNOSIS — R0602 Shortness of breath: Secondary | ICD-10-CM | POA: Diagnosis not present

## 2023-01-23 DIAGNOSIS — H538 Other visual disturbances: Secondary | ICD-10-CM | POA: Diagnosis not present

## 2023-01-23 DIAGNOSIS — R531 Weakness: Secondary | ICD-10-CM | POA: Diagnosis not present

## 2023-01-23 DIAGNOSIS — G20C Parkinsonism, unspecified: Secondary | ICD-10-CM | POA: Diagnosis not present

## 2023-01-23 DIAGNOSIS — Z888 Allergy status to other drugs, medicaments and biological substances status: Secondary | ICD-10-CM | POA: Diagnosis not present

## 2023-01-23 DIAGNOSIS — S4992XA Unspecified injury of left shoulder and upper arm, initial encounter: Secondary | ICD-10-CM | POA: Diagnosis not present

## 2023-01-23 DIAGNOSIS — R42 Dizziness and giddiness: Secondary | ICD-10-CM | POA: Diagnosis not present

## 2023-01-23 DIAGNOSIS — M25512 Pain in left shoulder: Secondary | ICD-10-CM | POA: Diagnosis not present

## 2023-01-23 DIAGNOSIS — Z7901 Long term (current) use of anticoagulants: Secondary | ICD-10-CM | POA: Diagnosis not present

## 2023-01-23 DIAGNOSIS — W19XXXA Unspecified fall, initial encounter: Secondary | ICD-10-CM | POA: Diagnosis not present

## 2023-01-23 DIAGNOSIS — G319 Degenerative disease of nervous system, unspecified: Secondary | ICD-10-CM | POA: Diagnosis not present

## 2023-01-23 DIAGNOSIS — R069 Unspecified abnormalities of breathing: Secondary | ICD-10-CM | POA: Diagnosis not present

## 2023-01-23 DIAGNOSIS — Z1152 Encounter for screening for COVID-19: Secondary | ICD-10-CM | POA: Diagnosis not present

## 2023-01-23 DIAGNOSIS — Z79899 Other long term (current) drug therapy: Secondary | ICD-10-CM | POA: Diagnosis not present

## 2023-01-23 DIAGNOSIS — Z8679 Personal history of other diseases of the circulatory system: Secondary | ICD-10-CM | POA: Diagnosis not present

## 2023-01-23 DIAGNOSIS — I10 Essential (primary) hypertension: Secondary | ICD-10-CM | POA: Diagnosis not present

## 2023-01-23 DIAGNOSIS — Z8669 Personal history of other diseases of the nervous system and sense organs: Secondary | ICD-10-CM | POA: Diagnosis not present

## 2023-01-30 DIAGNOSIS — Z950 Presence of cardiac pacemaker: Secondary | ICD-10-CM | POA: Diagnosis not present

## 2023-01-30 DIAGNOSIS — I4892 Unspecified atrial flutter: Secondary | ICD-10-CM | POA: Diagnosis not present

## 2023-01-30 DIAGNOSIS — R001 Bradycardia, unspecified: Secondary | ICD-10-CM | POA: Diagnosis not present

## 2023-01-30 DIAGNOSIS — I4891 Unspecified atrial fibrillation: Secondary | ICD-10-CM | POA: Diagnosis not present

## 2023-01-30 DIAGNOSIS — Z7901 Long term (current) use of anticoagulants: Secondary | ICD-10-CM | POA: Diagnosis not present

## 2023-01-30 DIAGNOSIS — Z791 Long term (current) use of non-steroidal anti-inflammatories (NSAID): Secondary | ICD-10-CM | POA: Diagnosis not present

## 2023-01-30 DIAGNOSIS — M19012 Primary osteoarthritis, left shoulder: Secondary | ICD-10-CM | POA: Diagnosis not present

## 2023-01-30 DIAGNOSIS — R42 Dizziness and giddiness: Secondary | ICD-10-CM | POA: Diagnosis not present

## 2023-01-30 DIAGNOSIS — Z9181 History of falling: Secondary | ICD-10-CM | POA: Diagnosis not present

## 2023-01-30 DIAGNOSIS — G20A1 Parkinson's disease without dyskinesia, without mention of fluctuations: Secondary | ICD-10-CM | POA: Diagnosis not present

## 2023-01-30 DIAGNOSIS — H538 Other visual disturbances: Secondary | ICD-10-CM | POA: Diagnosis not present

## 2023-01-31 DIAGNOSIS — R270 Ataxia, unspecified: Secondary | ICD-10-CM | POA: Diagnosis not present

## 2023-02-03 DIAGNOSIS — R42 Dizziness and giddiness: Secondary | ICD-10-CM | POA: Diagnosis not present

## 2023-02-03 DIAGNOSIS — M19012 Primary osteoarthritis, left shoulder: Secondary | ICD-10-CM | POA: Diagnosis not present

## 2023-02-03 DIAGNOSIS — Z7901 Long term (current) use of anticoagulants: Secondary | ICD-10-CM | POA: Diagnosis not present

## 2023-02-03 DIAGNOSIS — I4891 Unspecified atrial fibrillation: Secondary | ICD-10-CM | POA: Diagnosis not present

## 2023-02-03 DIAGNOSIS — H538 Other visual disturbances: Secondary | ICD-10-CM | POA: Diagnosis not present

## 2023-02-03 DIAGNOSIS — I4892 Unspecified atrial flutter: Secondary | ICD-10-CM | POA: Diagnosis not present

## 2023-02-03 DIAGNOSIS — Z9181 History of falling: Secondary | ICD-10-CM | POA: Diagnosis not present

## 2023-02-03 DIAGNOSIS — R001 Bradycardia, unspecified: Secondary | ICD-10-CM | POA: Diagnosis not present

## 2023-02-03 DIAGNOSIS — Z791 Long term (current) use of non-steroidal anti-inflammatories (NSAID): Secondary | ICD-10-CM | POA: Diagnosis not present

## 2023-02-03 DIAGNOSIS — Z950 Presence of cardiac pacemaker: Secondary | ICD-10-CM | POA: Diagnosis not present

## 2023-02-03 DIAGNOSIS — G20A1 Parkinson's disease without dyskinesia, without mention of fluctuations: Secondary | ICD-10-CM | POA: Diagnosis not present

## 2023-02-05 DIAGNOSIS — I4892 Unspecified atrial flutter: Secondary | ICD-10-CM | POA: Diagnosis not present

## 2023-02-05 DIAGNOSIS — G20A1 Parkinson's disease without dyskinesia, without mention of fluctuations: Secondary | ICD-10-CM | POA: Diagnosis not present

## 2023-02-05 DIAGNOSIS — I4891 Unspecified atrial fibrillation: Secondary | ICD-10-CM | POA: Diagnosis not present

## 2023-02-05 DIAGNOSIS — R001 Bradycardia, unspecified: Secondary | ICD-10-CM | POA: Diagnosis not present

## 2023-02-05 DIAGNOSIS — M19012 Primary osteoarthritis, left shoulder: Secondary | ICD-10-CM | POA: Diagnosis not present

## 2023-02-05 DIAGNOSIS — Z791 Long term (current) use of non-steroidal anti-inflammatories (NSAID): Secondary | ICD-10-CM | POA: Diagnosis not present

## 2023-02-05 DIAGNOSIS — Z9181 History of falling: Secondary | ICD-10-CM | POA: Diagnosis not present

## 2023-02-05 DIAGNOSIS — R42 Dizziness and giddiness: Secondary | ICD-10-CM | POA: Diagnosis not present

## 2023-02-05 DIAGNOSIS — Z7901 Long term (current) use of anticoagulants: Secondary | ICD-10-CM | POA: Diagnosis not present

## 2023-02-05 DIAGNOSIS — H538 Other visual disturbances: Secondary | ICD-10-CM | POA: Diagnosis not present

## 2023-02-05 DIAGNOSIS — Z950 Presence of cardiac pacemaker: Secondary | ICD-10-CM | POA: Diagnosis not present

## 2023-02-06 DIAGNOSIS — Z791 Long term (current) use of non-steroidal anti-inflammatories (NSAID): Secondary | ICD-10-CM | POA: Diagnosis not present

## 2023-02-06 DIAGNOSIS — I4892 Unspecified atrial flutter: Secondary | ICD-10-CM | POA: Diagnosis not present

## 2023-02-06 DIAGNOSIS — Z9181 History of falling: Secondary | ICD-10-CM | POA: Diagnosis not present

## 2023-02-06 DIAGNOSIS — Z7901 Long term (current) use of anticoagulants: Secondary | ICD-10-CM | POA: Diagnosis not present

## 2023-02-06 DIAGNOSIS — G20A1 Parkinson's disease without dyskinesia, without mention of fluctuations: Secondary | ICD-10-CM | POA: Diagnosis not present

## 2023-02-06 DIAGNOSIS — R001 Bradycardia, unspecified: Secondary | ICD-10-CM | POA: Diagnosis not present

## 2023-02-06 DIAGNOSIS — M19012 Primary osteoarthritis, left shoulder: Secondary | ICD-10-CM | POA: Diagnosis not present

## 2023-02-06 DIAGNOSIS — H538 Other visual disturbances: Secondary | ICD-10-CM | POA: Diagnosis not present

## 2023-02-06 DIAGNOSIS — I4891 Unspecified atrial fibrillation: Secondary | ICD-10-CM | POA: Diagnosis not present

## 2023-02-06 DIAGNOSIS — R42 Dizziness and giddiness: Secondary | ICD-10-CM | POA: Diagnosis not present

## 2023-02-06 DIAGNOSIS — Z950 Presence of cardiac pacemaker: Secondary | ICD-10-CM | POA: Diagnosis not present

## 2023-02-10 DIAGNOSIS — Z950 Presence of cardiac pacemaker: Secondary | ICD-10-CM | POA: Diagnosis not present

## 2023-02-10 DIAGNOSIS — H538 Other visual disturbances: Secondary | ICD-10-CM | POA: Diagnosis not present

## 2023-02-10 DIAGNOSIS — R42 Dizziness and giddiness: Secondary | ICD-10-CM | POA: Diagnosis not present

## 2023-02-10 DIAGNOSIS — Z9181 History of falling: Secondary | ICD-10-CM | POA: Diagnosis not present

## 2023-02-10 DIAGNOSIS — I4891 Unspecified atrial fibrillation: Secondary | ICD-10-CM | POA: Diagnosis not present

## 2023-02-10 DIAGNOSIS — M19012 Primary osteoarthritis, left shoulder: Secondary | ICD-10-CM | POA: Diagnosis not present

## 2023-02-10 DIAGNOSIS — R001 Bradycardia, unspecified: Secondary | ICD-10-CM | POA: Diagnosis not present

## 2023-02-10 DIAGNOSIS — I4892 Unspecified atrial flutter: Secondary | ICD-10-CM | POA: Diagnosis not present

## 2023-02-10 DIAGNOSIS — Z791 Long term (current) use of non-steroidal anti-inflammatories (NSAID): Secondary | ICD-10-CM | POA: Diagnosis not present

## 2023-02-10 DIAGNOSIS — G20A1 Parkinson's disease without dyskinesia, without mention of fluctuations: Secondary | ICD-10-CM | POA: Diagnosis not present

## 2023-02-10 DIAGNOSIS — Z7901 Long term (current) use of anticoagulants: Secondary | ICD-10-CM | POA: Diagnosis not present

## 2023-02-11 DIAGNOSIS — Z7901 Long term (current) use of anticoagulants: Secondary | ICD-10-CM | POA: Diagnosis not present

## 2023-02-11 DIAGNOSIS — Z791 Long term (current) use of non-steroidal anti-inflammatories (NSAID): Secondary | ICD-10-CM | POA: Diagnosis not present

## 2023-02-11 DIAGNOSIS — Z9181 History of falling: Secondary | ICD-10-CM | POA: Diagnosis not present

## 2023-02-11 DIAGNOSIS — Z950 Presence of cardiac pacemaker: Secondary | ICD-10-CM | POA: Diagnosis not present

## 2023-02-11 DIAGNOSIS — H538 Other visual disturbances: Secondary | ICD-10-CM | POA: Diagnosis not present

## 2023-02-11 DIAGNOSIS — R42 Dizziness and giddiness: Secondary | ICD-10-CM | POA: Diagnosis not present

## 2023-02-11 DIAGNOSIS — R001 Bradycardia, unspecified: Secondary | ICD-10-CM | POA: Diagnosis not present

## 2023-02-11 DIAGNOSIS — I4891 Unspecified atrial fibrillation: Secondary | ICD-10-CM | POA: Diagnosis not present

## 2023-02-11 DIAGNOSIS — I4892 Unspecified atrial flutter: Secondary | ICD-10-CM | POA: Diagnosis not present

## 2023-02-11 DIAGNOSIS — M19012 Primary osteoarthritis, left shoulder: Secondary | ICD-10-CM | POA: Diagnosis not present

## 2023-02-11 DIAGNOSIS — G20A1 Parkinson's disease without dyskinesia, without mention of fluctuations: Secondary | ICD-10-CM | POA: Diagnosis not present

## 2023-02-13 DIAGNOSIS — H538 Other visual disturbances: Secondary | ICD-10-CM | POA: Diagnosis not present

## 2023-02-13 DIAGNOSIS — I4892 Unspecified atrial flutter: Secondary | ICD-10-CM | POA: Diagnosis not present

## 2023-02-13 DIAGNOSIS — Z950 Presence of cardiac pacemaker: Secondary | ICD-10-CM | POA: Diagnosis not present

## 2023-02-13 DIAGNOSIS — Z7901 Long term (current) use of anticoagulants: Secondary | ICD-10-CM | POA: Diagnosis not present

## 2023-02-13 DIAGNOSIS — R42 Dizziness and giddiness: Secondary | ICD-10-CM | POA: Diagnosis not present

## 2023-02-13 DIAGNOSIS — Z791 Long term (current) use of non-steroidal anti-inflammatories (NSAID): Secondary | ICD-10-CM | POA: Diagnosis not present

## 2023-02-13 DIAGNOSIS — G20A1 Parkinson's disease without dyskinesia, without mention of fluctuations: Secondary | ICD-10-CM | POA: Diagnosis not present

## 2023-02-13 DIAGNOSIS — R001 Bradycardia, unspecified: Secondary | ICD-10-CM | POA: Diagnosis not present

## 2023-02-13 DIAGNOSIS — M19012 Primary osteoarthritis, left shoulder: Secondary | ICD-10-CM | POA: Diagnosis not present

## 2023-02-13 DIAGNOSIS — I4891 Unspecified atrial fibrillation: Secondary | ICD-10-CM | POA: Diagnosis not present

## 2023-02-13 DIAGNOSIS — Z9181 History of falling: Secondary | ICD-10-CM | POA: Diagnosis not present

## 2023-02-17 DIAGNOSIS — R001 Bradycardia, unspecified: Secondary | ICD-10-CM | POA: Diagnosis not present

## 2023-02-17 DIAGNOSIS — M19012 Primary osteoarthritis, left shoulder: Secondary | ICD-10-CM | POA: Diagnosis not present

## 2023-02-17 DIAGNOSIS — H538 Other visual disturbances: Secondary | ICD-10-CM | POA: Diagnosis not present

## 2023-02-17 DIAGNOSIS — Z791 Long term (current) use of non-steroidal anti-inflammatories (NSAID): Secondary | ICD-10-CM | POA: Diagnosis not present

## 2023-02-17 DIAGNOSIS — Z950 Presence of cardiac pacemaker: Secondary | ICD-10-CM | POA: Diagnosis not present

## 2023-02-17 DIAGNOSIS — Z7901 Long term (current) use of anticoagulants: Secondary | ICD-10-CM | POA: Diagnosis not present

## 2023-02-17 DIAGNOSIS — G20A1 Parkinson's disease without dyskinesia, without mention of fluctuations: Secondary | ICD-10-CM | POA: Diagnosis not present

## 2023-02-17 DIAGNOSIS — R42 Dizziness and giddiness: Secondary | ICD-10-CM | POA: Diagnosis not present

## 2023-02-17 DIAGNOSIS — I4892 Unspecified atrial flutter: Secondary | ICD-10-CM | POA: Diagnosis not present

## 2023-02-17 DIAGNOSIS — I4891 Unspecified atrial fibrillation: Secondary | ICD-10-CM | POA: Diagnosis not present

## 2023-02-17 DIAGNOSIS — Z9181 History of falling: Secondary | ICD-10-CM | POA: Diagnosis not present

## 2023-02-21 DIAGNOSIS — R42 Dizziness and giddiness: Secondary | ICD-10-CM | POA: Diagnosis not present

## 2023-02-21 DIAGNOSIS — Z950 Presence of cardiac pacemaker: Secondary | ICD-10-CM | POA: Diagnosis not present

## 2023-02-21 DIAGNOSIS — I4892 Unspecified atrial flutter: Secondary | ICD-10-CM | POA: Diagnosis not present

## 2023-02-21 DIAGNOSIS — H538 Other visual disturbances: Secondary | ICD-10-CM | POA: Diagnosis not present

## 2023-02-21 DIAGNOSIS — Z7901 Long term (current) use of anticoagulants: Secondary | ICD-10-CM | POA: Diagnosis not present

## 2023-02-21 DIAGNOSIS — M19012 Primary osteoarthritis, left shoulder: Secondary | ICD-10-CM | POA: Diagnosis not present

## 2023-02-21 DIAGNOSIS — I4891 Unspecified atrial fibrillation: Secondary | ICD-10-CM | POA: Diagnosis not present

## 2023-02-21 DIAGNOSIS — Z9181 History of falling: Secondary | ICD-10-CM | POA: Diagnosis not present

## 2023-02-21 DIAGNOSIS — R001 Bradycardia, unspecified: Secondary | ICD-10-CM | POA: Diagnosis not present

## 2023-02-21 DIAGNOSIS — Z791 Long term (current) use of non-steroidal anti-inflammatories (NSAID): Secondary | ICD-10-CM | POA: Diagnosis not present

## 2023-02-21 DIAGNOSIS — G20A1 Parkinson's disease without dyskinesia, without mention of fluctuations: Secondary | ICD-10-CM | POA: Diagnosis not present
